# Patient Record
Sex: Female | Born: 1980 | Hispanic: Yes | Marital: Single | Smoking: Never smoker
Health system: Southern US, Community
[De-identification: ages and names within clinical notes are randomized; demographics above are authoritative.]

## PROBLEM LIST (undated history)

## (undated) ENCOUNTER — Inpatient Hospital Stay (HOSPITAL_COMMUNITY): Payer: Self-pay

## (undated) DIAGNOSIS — D259 Leiomyoma of uterus, unspecified: Secondary | ICD-10-CM

## (undated) DIAGNOSIS — R1110 Vomiting, unspecified: Secondary | ICD-10-CM

## (undated) DIAGNOSIS — D649 Anemia, unspecified: Secondary | ICD-10-CM

## (undated) HISTORY — DX: Leiomyoma of uterus, unspecified: D25.9

---

## 2013-09-21 HISTORY — PX: APPENDECTOMY: SHX54

## 2017-09-21 NOTE — L&D Delivery Note (Signed)
OB/GYN Faculty Practice Delivery Note  Gabriela Morgan is a 37 y.o. [id number] s/p SVD at [redacted]w[redacted]d. She was admitted for early labor with cervical change in multiparous patient.   ROM: 1h 64m with clear fluid GBS Status: positive - received > 2 doses of PCN Maximum Maternal Temperature: Temp (24hrs), Avg:98.1 F (36.7 C), Min:98 F (36.7 C), Max:98.3 F (36.8 C)  Labor Progress: . Admitted in early labor . AROM . Progressed to complete  Delivery Date/Time: 07/26/18 at 2231 Delivery: Called to room and patient was complete and pushing. Head delivered LOA. No nuchal cord present. Shoulder and body delivered in usual fashion. Infant with spontaneous cry, placed on mother's abdomen, dried and stimulated. Cord clamped x 2 after 1-minute delay, and cut by father of baby. Cord blood drawn. Placenta delivered spontaneously with gentle cord traction. Fundus firm with massage and Pitocin. Labia, perineum, vagina, and cervix inspected inspected with 2nd degree perineal laceration.   Placenta: spontaneous, intact, 3-vessel cord Complications: none Lacerations: 2nd degree laceration repaired with 3-0 Vicryl EBL: 108cc  Postpartum Planning [x]  message to sent to schedule follow-up  [x]  vaccines UTD  Infant: vigorous female  APGARs 8, 9  weight pending  Laurel S. Juleen China, DO OB/GYN Fellow, Faculty Practice

## 2018-02-20 ENCOUNTER — Emergency Department (HOSPITAL_COMMUNITY): Payer: Self-pay

## 2018-02-20 ENCOUNTER — Encounter (HOSPITAL_COMMUNITY): Payer: Self-pay

## 2018-02-20 ENCOUNTER — Other Ambulatory Visit: Payer: Self-pay

## 2018-02-20 ENCOUNTER — Emergency Department (HOSPITAL_COMMUNITY)
Admission: EM | Admit: 2018-02-20 | Discharge: 2018-02-20 | Disposition: A | Discharge status: Home / Self Care | Payer: Self-pay | Attending: Emergency Medicine | Admitting: Emergency Medicine

## 2018-02-20 DIAGNOSIS — Z3A16 16 weeks gestation of pregnancy: Secondary | ICD-10-CM | POA: Insufficient documentation

## 2018-02-20 DIAGNOSIS — O219 Vomiting of pregnancy, unspecified: Secondary | ICD-10-CM | POA: Insufficient documentation

## 2018-02-20 LAB — CBC
HEMATOCRIT: 38.1 % (ref 36.0–46.0)
Hemoglobin: 12.4 g/dL (ref 12.0–15.0)
MCH: 28.1 pg (ref 26.0–34.0)
MCHC: 32.5 g/dL (ref 30.0–36.0)
MCV: 86.2 fL (ref 78.0–100.0)
Platelets: 255 10*3/uL (ref 150–400)
RBC: 4.42 MIL/uL (ref 3.87–5.11)
RDW: 13.6 % (ref 11.5–15.5)
WBC: 15 10*3/uL — AB (ref 4.0–10.5)

## 2018-02-20 LAB — COMPREHENSIVE METABOLIC PANEL
ALBUMIN: 3.5 g/dL (ref 3.5–5.0)
ALT: 21 U/L (ref 14–54)
AST: 17 U/L (ref 15–41)
Alkaline Phosphatase: 46 U/L (ref 38–126)
Anion gap: 9 (ref 5–15)
BUN: 8 mg/dL (ref 6–20)
CHLORIDE: 105 mmol/L (ref 101–111)
CO2: 22 mmol/L (ref 22–32)
Calcium: 8.5 mg/dL — ABNORMAL LOW (ref 8.9–10.3)
Creatinine, Ser: 0.48 mg/dL (ref 0.44–1.00)
GFR calc Af Amer: >60 mL/min (ref >60)
GLUCOSE: 117 mg/dL — AB (ref 65–99)
POTASSIUM: 3.4 mmol/L — AB (ref 3.5–5.1)
SODIUM: 136 mmol/L (ref 135–145)
Total Bilirubin: 0.7 mg/dL (ref 0.3–1.2)
Total Protein: 7.1 g/dL (ref 6.5–8.1)

## 2018-02-20 LAB — I-STAT BETA HCG BLOOD, ED (MC, WL, AP ONLY)

## 2018-02-20 LAB — URINALYSIS, ROUTINE W REFLEX MICROSCOPIC
BILIRUBIN URINE: NEGATIVE
Glucose, UA: NEGATIVE mg/dL
Hgb urine dipstick: NEGATIVE
KETONES UR: 80 mg/dL — AB
Nitrite: NEGATIVE
Protein, ur: 100 mg/dL — AB
SPECIFIC GRAVITY, URINE: 1.031 — AB (ref 1.005–1.030)
pH: 5 (ref 5.0–8.0)

## 2018-02-20 LAB — LIPASE, BLOOD: LIPASE: 27 U/L (ref 11–51)

## 2018-02-20 MED ORDER — METOCLOPRAMIDE HCL 5 MG/ML IJ SOLN
10.0000 mg | Freq: Once | INTRAMUSCULAR | Status: AC
  Administered 2018-02-20: 10 mg via INTRAVENOUS
  Filled 2018-02-20: qty 2

## 2018-02-20 MED ORDER — DOXYLAMINE SUCCINATE (SLEEP) 25 MG PO TABS: 25.0000 mg | ORAL_TABLET | Freq: Every evening | ORAL | Status: DC | PRN

## 2018-02-20 MED ORDER — MORPHINE SULFATE (PF) 4 MG/ML IV SOLN
4.0000 mg | Freq: Once | INTRAVENOUS | Status: AC
  Administered 2018-02-20: 4 mg via INTRAVENOUS
  Filled 2018-02-20: qty 1

## 2018-02-20 MED ORDER — ALUM & MAG HYDROXIDE-SIMETH 200-200-20 MG/5ML PO SUSP
15.0000 mL | Freq: Once | ORAL | Status: AC
  Administered 2018-02-20: 15 mL via ORAL
  Filled 2018-02-20: qty 30

## 2018-02-20 MED ORDER — SODIUM CHLORIDE 0.9 % IV BOLUS
1000.0000 mL | Freq: Once | INTRAVENOUS | Status: AC
  Administered 2018-02-20: 1000 mL via INTRAVENOUS

## 2018-02-20 MED ORDER — VITAMIN B-6 25 MG PO TABS
25.0000 mg | ORAL_TABLET | Freq: Every day | ORAL | Status: DC
  Filled 2018-02-20: qty 1

## 2018-02-20 MED ORDER — METOCLOPRAMIDE HCL 10 MG PO TABS: 10.0000 mg | ORAL_TABLET | Freq: Four times a day (QID) | ORAL | 0 refills | Status: DC | PRN

## 2018-02-20 NOTE — ED Notes (Signed)
Attempted IV x2. This RN has asked another RN to start a line.

## 2018-02-20 NOTE — ED Provider Notes (Signed)
[facility] EMERGENCY DEPARTMENT Provider Note   CSN: [id number] Arrival date & time: 02/20/18  1435     History   Chief Complaint Chief Complaint  Patient presents with  . Abdominal Pain    HPI Gabriela Morgan is a 37 y.o. female.  37 yo F with a chief complaint of abdominal pain.  This is described as diffuse.  Started yesterday morning as a dull ache and then worsened throughout the day to where it was severe this morning.  She is vomited multiple times including some that she thought was bright red.  She denies diarrhea denies constipation denies vaginal bleeding or discharge.  The patient's last menstrual cycle was about 3 months ago.  She knows that she is pregnant and has not sought medical care.  She has 3 prior children that were born via vaginal delivery.  No known complications as far she could tell me.  The history is provided by the patient.  Abdominal Pain   This is a new problem. The current episode started less than 1 hour ago. The problem occurs constantly. The pain is associated with eating. The quality of the pain is sharp. The pain is at a severity of 10/10. The pain is severe. Pertinent negatives include fever, nausea, vomiting, dysuria, headaches, arthralgias and myalgias. Nothing aggravates the symptoms. Nothing relieves the symptoms.    History reviewed. No pertinent past medical history.  There are no active problems to display for this patient.   Past Surgical History:  Procedure Laterality Date  . APPENDECTOMY       OB History    Gravida  1   Para      Term      Preterm      AB      Living        SAB      TAB      Ectopic      Multiple      Live Births               Home Medications    Prior to Admission medications   Medication Sig Start Date End Date Taking? Authorizing Provider  metoCLOPramide (REGLAN) 10 MG tablet Take 1 tablet (10 mg total) by mouth every 6 (six) hours as needed for nausea  (nausea/headache). 02/20/18   Deno Etienne, DO    Family History No family history on file.  Social History Social History   Tobacco Use  . Smoking status: Never Smoker  . Smokeless tobacco: Never Used  Substance Use Topics  . Alcohol use: Not Currently  . Drug use: Not Currently     Allergies   Patient has no known allergies.   Review of Systems Review of Systems  Constitutional: Negative for chills and fever.  HENT: Negative for congestion and rhinorrhea.   Eyes: Negative for redness and visual disturbance.  Respiratory: Negative for shortness of breath and wheezing.   Cardiovascular: Negative for chest pain and palpitations.  Gastrointestinal: Positive for abdominal pain. Negative for nausea and vomiting.  Genitourinary: Negative for dysuria and urgency.  Musculoskeletal: Negative for arthralgias and myalgias.  Skin: Negative for pallor and wound.  Neurological: Negative for dizziness and headaches.     Physical Exam Updated Vital Signs BP 116/69   Pulse 86   Temp 97.9 F (36.6 C) (Oral)   Resp 14   Ht 5' 2.99" (1.6 m)   Wt 65.8 kg (145 lb)   LMP 11/19/2017 Comment: pt has not  been seen yet for this pregnancy   SpO2 99%   BMI 25.69 kg/m   Physical Exam  Constitutional: She is oriented to person, place, and time. She appears well-developed and well-nourished. No distress.  HENT:  Head: Normocephalic and atraumatic.  Eyes: Pupils are equal, round, and reactive to light. EOM are normal.  Neck: Normal range of motion. Neck supple.  Cardiovascular: Normal rate and regular rhythm. Exam reveals no gallop and no friction rub.  No murmur heard. Pulmonary/Chest: Effort normal. She has no wheezes. She has no rales.  Abdominal: Soft. She exhibits no distension. There is tenderness in the right upper quadrant. There is negative Murphy's sign.  Musculoskeletal: She exhibits no edema or tenderness.  Neurological: She is alert and oriented to person, place, and time.    Skin: Skin is warm and dry. She is not diaphoretic.  Psychiatric: She has a normal mood and affect. Her behavior is normal.  Nursing note and vitals reviewed.    ED Treatments / Results  Labs (all labs ordered are listed, but only abnormal results are displayed) Labs Reviewed  COMPREHENSIVE METABOLIC PANEL - Abnormal; Notable for the following components:      Result Value   Potassium 3.4 (*)    Glucose, Bld 117 (*)    Calcium 8.5 (*)    All other components within normal limits  CBC - Abnormal; Notable for the following components:   WBC 15.0 (*)    All other components within normal limits  URINALYSIS, ROUTINE W REFLEX MICROSCOPIC - Abnormal; Notable for the following components:   APPearance HAZY (*)    Specific Gravity, Urine 1.031 (*)    Ketones, ur 80 (*)    Protein, ur 100 (*)    Leukocytes, UA TRACE (*)    Bacteria, UA RARE (*)    All other components within normal limits  I-STAT BETA HCG BLOOD, ED ([facility], [facility], [facility] ONLY) - Abnormal; Notable for the following components:   I-stat hCG, quantitative >2,000.0 (*)    All other components within normal limits  LIPASE, BLOOD    EKG None  Radiology US Abdomen Complete  Result Date: 02/20/2018 CLINICAL DATA:  Right upper quadrant pain for 1 day, currently pregnant EXAM: ABDOMEN ULTRASOUND COMPLETE COMPARISON:  None. FINDINGS: Gallbladder: No gallstones or wall thickening visualized. No sonographic Murphy sign noted by sonographer. Common bile duct: Diameter: 2 mm Liver: No focal lesion identified. Within normal limits in parenchymal echogenicity. Portal vein is patent on color Doppler imaging with normal direction of blood flow towards the liver. IVC: No abnormality visualized. Pancreas: Visualized portion unremarkable. Spleen: Size and appearance within normal limits. Right Kidney: Length: 11.7 cm. Mild hydronephrosis is noted. This is likely related to pressure on the ureter from the gravid uterus. Left Kidney: Length: 11.3 cm.  Echogenicity within normal limits. No mass or hydronephrosis visualized. Abdominal aorta: No aneurysm visualized. Other findings: Note is made of uterine fibroid measuring 5.4 cm in greatest dimension in the fundus on the left. IMPRESSION: Mild right hydronephrosis likely related to compression of the ureter by the gravid uterus. Uterine fibroid as described. Electronically Signed   By: Inez Catalina M.D.   On: 02/20/2018 20:46    Procedures Procedures (including critical care time) EMERGENCY DEPARTMENT [place] PREGNANCY "Study: Limited Ultrasound of the Pelvis"  INDICATIONS:Pregnancy(required) and Abdominal or pelvic pain Multiple views of the uterus and pelvic cavity are obtained with a multi-frequency probe.  APPROACH:Transabdominal   PERFORMED BY: Myself  IMAGES ARCHIVED?: Yes  LIMITATIONS:  pain  PREGNANCY FREE FLUID: None  PREGNANCY UTERUS FINDINGS:Uterus enlarged and Gestational sac noted ADNEXAL FINDINGS:Left ovary not seen and Right ovary not seen  PREGNANCY FINDINGS: Intrauterine gestational sac noted and Fetal heart activity seen  INTERPRETATION: Intrauterine gestational sac noted and Fetal heart activity seen  GESTATIONAL AGE, ESTIMATE: 16wk 3 days  Procedure note: Ultrasound Guided Peripheral IV Ultrasound guided peripheral 1.88 inch angiocath IV placement performed by me. Indications: Nursing unable to place IV. Details: The antecubital fossa and upper arm were evaluated with a multifrequency linear probe. Patent brachial veins were noted. 1 attempt was made to cannulate a vein under realtime US guidance with successful cannulation of the vein and catheter placement. There is return of non-pulsatile dark red blood. The patient tolerated the procedure well without complications. Images archived electronically.  CPT codes: [id number] and [phone]   Medications Ordered in ED Medications  vitamin B-6 (pyridOXINE) tablet 25 mg (has no administration in time range)  morphine 4 MG/ML  injection 4 mg (4 mg Intravenous Given 02/20/18 1814)  metoCLOPramide (REGLAN) injection 10 mg (10 mg Intravenous Given 02/20/18 1813)  sodium chloride 0.9 % bolus 1,000 mL (0 mLs Intravenous Stopped 02/20/18 2120)  alum & mag hydroxide-simeth (MAALOX/MYLANTA) 200-200-20 MG/5ML suspension 15 mL (15 mLs Oral Given 02/20/18 2119)  metoCLOPramide (REGLAN) injection 10 mg (10 mg Intravenous Given 02/20/18 2120)     Initial Impression / Assessment and Plan / ED Course  I have reviewed the triage vital signs and the nursing notes.  Pertinent labs & imaging results that were available during my care of the patient were reviewed by me and considered in my medical decision making (see chart for details).     37 yo F gravida 4 para 3-0-0-3 with a chief complaint of diffuse abdominal pain and vomiting.  Going on for the past couple days.  Worsening throughout the day.  Patient is writhing around on the stretcher.  There is no focal abdominal pain but she seems to be worse in the right upper quadrant.  She also is about 3 months pregnant and has not sought OB care due to insurance issues.  Bedside ultrasound with IUP.  No adnexal masses noted on my ultrasound.  I will try to spare radiation and do an ultrasound of the right upper quadrant and bilateral flanks.  Treat symptomatically.  I discussed with her that there is a risk to any medication given.  Feeling better.  [place] with R hydro, but thought to be secondary to uterine compression of the ureter.  No hematuria.  Tolerating PO.  Will do trial zantac, given script for reglan, given information to make diclegis at home.  Discussed at length need for OB prenatal care.   10:18 PM:  I have discussed the diagnosis/risks/treatment options with the patient and family and believe the pt to be eligible for discharge home to follow-up with OBGYN. We also discussed returning to the ED immediately if new or worsening sx occur. We discussed the sx which are most concerning (e.g.,  sudden worsening pain, fever, inability to tolerate by mouth) that necessitate immediate return. Medications administered to the patient during their visit and any new prescriptions provided to the patient are listed below.  Medications given during this visit Medications  vitamin B-6 (pyridOXINE) tablet 25 mg (has no administration in time range)  morphine 4 MG/ML injection 4 mg (4 mg Intravenous Given 02/20/18 1814)  metoCLOPramide (REGLAN) injection 10 mg (10 mg Intravenous Given 02/20/18 1813)  sodium chloride 0.9 %  bolus 1,000 mL (0 mLs Intravenous Stopped 02/20/18 2120)  alum & mag hydroxide-simeth (MAALOX/MYLANTA) 200-200-20 MG/5ML suspension 15 mL (15 mLs Oral Given 02/20/18 2119)  metoCLOPramide (REGLAN) injection 10 mg (10 mg Intravenous Given 02/20/18 2120)    The patient appears reasonably screen and/or stabilized for discharge and I doubt any other medical condition or other [facility] requiring further screening, evaluation, or treatment in the ED at this time prior to discharge.    Final Clinical Impressions(s) / ED Diagnoses   Final diagnoses:  Nausea and vomiting in pregnancy    ED Discharge Orders        Ordered    metoCLOPramide (REGLAN) 10 MG tablet  Every 6 hours PRN     02/20/18 2130       Deno Etienne, DO 02/20/18 2218

## 2018-02-20 NOTE — ED Notes (Signed)
Pt returned to room from US.

## 2018-02-20 NOTE — ED Notes (Signed)
Patient transported to Ultrasound 

## 2018-02-20 NOTE — ED Triage Notes (Signed)
With the interpreter iPad, pt reports pain in lower abdomen with a lot of vomiting. Pt reports blood in vomit and that it is sometimes black.

## 2018-02-20 NOTE — Discharge Instructions (Addendum)
Try zantac.  Tylenol for pain.    Go to a drugstore and buy unisom and vitaminb6(pyridoxine) Take 1/2 a tab of unisom(12.5mg ) and 25mg  of vitamin b6.  Take this at night before bed.  If you continue to have nausea and vomiting take twice a day.  [facility]  Return to the ED if you are unable to keep anything down.

## 2018-02-22 ENCOUNTER — Inpatient Hospital Stay (HOSPITAL_COMMUNITY)
Admission: AD | Admit: 2018-02-22 | Discharge: 2018-02-22 | Disposition: A | Discharge status: Home / Self Care | Payer: Self-pay | Source: Ambulatory Visit | Attending: Obstetrics & Gynecology | Admitting: Obstetrics & Gynecology

## 2018-02-22 ENCOUNTER — Encounter (HOSPITAL_COMMUNITY): Payer: Self-pay | Admitting: *Deleted

## 2018-02-22 ENCOUNTER — Other Ambulatory Visit: Payer: Self-pay

## 2018-02-22 DIAGNOSIS — Z79899 Other long term (current) drug therapy: Secondary | ICD-10-CM | POA: Insufficient documentation

## 2018-02-22 DIAGNOSIS — R1010 Upper abdominal pain, unspecified: Secondary | ICD-10-CM | POA: Insufficient documentation

## 2018-02-22 DIAGNOSIS — O219 Vomiting of pregnancy, unspecified: Secondary | ICD-10-CM | POA: Diagnosis present

## 2018-02-22 DIAGNOSIS — Z3A13 13 weeks gestation of pregnancy: Secondary | ICD-10-CM | POA: Insufficient documentation

## 2018-02-22 DIAGNOSIS — O26891 Other specified pregnancy related conditions, first trimester: Secondary | ICD-10-CM | POA: Insufficient documentation

## 2018-02-22 LAB — COMPREHENSIVE METABOLIC PANEL
ALK PHOS: 50 U/L (ref 38–126)
ALT: 24 U/L (ref 14–54)
ANION GAP: 12 (ref 5–15)
AST: 20 U/L (ref 15–41)
Albumin: 3.4 g/dL — ABNORMAL LOW (ref 3.5–5.0)
BUN: 7 mg/dL (ref 6–20)
CALCIUM: 8.4 mg/dL — AB (ref 8.9–10.3)
CO2: 20 mmol/L — ABNORMAL LOW (ref 22–32)
CREATININE: 0.4 mg/dL — AB (ref 0.44–1.00)
Chloride: 99 mmol/L — ABNORMAL LOW (ref 101–111)
Glucose, Bld: 106 mg/dL — ABNORMAL HIGH (ref 65–99)
Potassium: 3 mmol/L — ABNORMAL LOW (ref 3.5–5.1)
SODIUM: 131 mmol/L — AB (ref 135–145)
TOTAL PROTEIN: 6.8 g/dL (ref 6.5–8.1)
Total Bilirubin: 0.4 mg/dL (ref 0.3–1.2)

## 2018-02-22 LAB — URINALYSIS, ROUTINE W REFLEX MICROSCOPIC
Glucose, UA: NEGATIVE mg/dL
KETONES UR: 15 mg/dL — AB
NITRITE: NEGATIVE
PH: 6.5 (ref 5.0–8.0)
Protein, ur: 30 mg/dL — AB
Specific Gravity, Urine: 1.025 (ref 1.005–1.030)

## 2018-02-22 LAB — CBC WITH DIFFERENTIAL/PLATELET
Basophils Absolute: 0 10*3/uL (ref 0.0–0.1)
Basophils Relative: 0 %
EOS ABS: 0 10*3/uL (ref 0.0–0.7)
Eosinophils Relative: 0 %
HEMATOCRIT: 36 % (ref 36.0–46.0)
HEMOGLOBIN: 12.5 g/dL (ref 12.0–15.0)
LYMPHS ABS: 1.6 10*3/uL (ref 0.7–4.0)
LYMPHS PCT: 11 %
MCH: 28.7 pg (ref 26.0–34.0)
MCHC: 34.7 g/dL (ref 30.0–36.0)
MCV: 82.6 fL (ref 78.0–100.0)
MONOS PCT: 7 %
Monocytes Absolute: 1 10*3/uL (ref 0.1–1.0)
NEUTROS PCT: 82 %
Neutro Abs: 11.5 10*3/uL — ABNORMAL HIGH (ref 1.7–7.7)
Platelets: 254 10*3/uL (ref 150–400)
RBC: 4.36 MIL/uL (ref 3.87–5.11)
RDW: 13.6 % (ref 11.5–15.5)
WBC: 14.1 10*3/uL — ABNORMAL HIGH (ref 4.0–10.5)

## 2018-02-22 LAB — URINALYSIS, MICROSCOPIC (REFLEX): Squamous Epithelial / LPF: >50 (ref 0–5)

## 2018-02-22 LAB — AMYLASE: AMYLASE: 61 U/L (ref 28–100)

## 2018-02-22 LAB — LIPASE, BLOOD: LIPASE: 45 U/L (ref 11–51)

## 2018-02-22 MED ORDER — PROMETHAZINE HCL 25 MG/ML IJ SOLN
25.0000 mg | Freq: Once | INTRAVENOUS | Status: AC
  Administered 2018-02-22: 25 mg via INTRAVENOUS
  Filled 2018-02-22: qty 1

## 2018-02-22 MED ORDER — ONDANSETRON 4 MG PO TBDP: 4.0000 mg | ORAL_TABLET | Freq: Three times a day (TID) | ORAL | 0 refills | Status: DC | PRN

## 2018-02-22 MED ORDER — DIPHENHYDRAMINE HCL 50 MG/ML IJ SOLN
25.0000 mg | INTRAMUSCULAR | Status: AC
  Administered 2018-02-22: 25 mg via INTRAVENOUS
  Filled 2018-02-22: qty 1

## 2018-02-22 MED ORDER — M.V.I. ADULT IV INJ
Freq: Once | INTRAVENOUS | Status: AC
  Administered 2018-02-22: 20:00:00 via INTRAVENOUS
  Filled 2018-02-22: qty 1000

## 2018-02-22 MED ORDER — SODIUM CHLORIDE 0.9 % IV SOLN
8.0000 mg | Freq: Once | INTRAVENOUS | Status: AC
  Administered 2018-02-22: 8 mg via INTRAVENOUS
  Filled 2018-02-22: qty 4

## 2018-02-22 MED ORDER — FAMOTIDINE IN NACL 20-0.9 MG/50ML-% IV SOLN
20.0000 mg | Freq: Once | INTRAVENOUS | Status: AC
  Administered 2018-02-22: 20 mg via INTRAVENOUS
  Filled 2018-02-22: qty 50

## 2018-02-22 MED ORDER — GLYCOPYRROLATE 1 MG PO TABS: 1.0000 mg | ORAL_TABLET | Freq: Three times a day (TID) | ORAL | 0 refills | Status: DC

## 2018-02-22 MED ORDER — PROMETHAZINE HCL 25 MG RE SUPP: 25.0000 mg | Freq: Four times a day (QID) | RECTAL | 3 refills | Status: DC | PRN

## 2018-02-22 MED ORDER — RANITIDINE HCL 150 MG PO TABS: 150.0000 mg | ORAL_TABLET | Freq: Two times a day (BID) | ORAL | 0 refills | Status: DC

## 2018-02-22 MED ORDER — DEXAMETHASONE SODIUM PHOSPHATE 10 MG/ML IJ SOLN
10.0000 mg | INTRAMUSCULAR | Status: AC
  Administered 2018-02-22: 10 mg via INTRAVENOUS
  Filled 2018-02-22: qty 1

## 2018-02-22 NOTE — MAU Note (Signed)
Pregnant, having a lot of vomiting.  Pain in upper abd from throwing up.  States was given medication for nausea when at [facility], is taking it. ? LMP 3/1.  Arrived in [place] 2 months ago.

## 2018-02-22 NOTE — MAU Provider Note (Addendum)
History     CSN: [id number]  Arrival date and time: 02/22/18 1539   First Provider Initiated Contact with Patient 02/22/18 1713 - assessment done with assistance from [place]    Chief Complaint  Patient presents with  . Abdominal Pain  . Emesis   HPI  [name] is a 37 y.o. year old G18P2103 female at [redacted]w[redacted]d weeks gestation who presents to MAU reporting "a lot of vomiting and upper abdominal pain from throwing up". She states she "unable to keep anything down; food or fluids". She reports "losing >10 lbs in 3 wks". She was given Zofran at [facility], but it not working all that well. She just arrived from [place] 2 months ago. LMP is 11/19/2017.  Past Medical History:  Diagnosis Date  . Medical history non-contributory     Past Surgical History:  Procedure Laterality Date  . APPENDECTOMY      No family history on file.  Social History   Tobacco Use  . Smoking status: Never Smoker  . Smokeless tobacco: Never Used  Substance Use Topics  . Alcohol use: Not Currently  . Drug use: Not Currently    Allergies: No Known Allergies  Medications Prior to Admission  Medication Sig Dispense Refill Last Dose  . metoCLOPramide (REGLAN) 10 MG tablet Take 1 tablet (10 mg total) by mouth every 6 (six) hours as needed for nausea (nausea/headache). 6 tablet 0 02/22/2018 at Unknown time  . Prenatal Vit-Fe Fumarate-FA (PRENATAL MULTIVITAMIN) TABS tablet Take 1 tablet by mouth daily at 12 noon.   02/22/2018 at Unknown time    Review of Systems  Constitutional: Positive for appetite change and fatigue.  HENT: Negative.   Eyes: Negative.   Respiratory: Negative.   Cardiovascular: Negative.   Gastrointestinal: Positive for abdominal pain (upper abd), nausea and vomiting ("unable to hold down anything").  Endocrine: Negative.   Genitourinary: Negative.   Musculoskeletal: Negative.   Skin: Negative.   Allergic/Immunologic: Negative.   Neurological: Positive for  weakness and headaches.  Hematological: Negative.   Psychiatric/Behavioral: Negative.    Physical Exam   Pulse 93, temperature 98.1 F (36.7 C), temperature source Oral, resp. rate 20, weight 124 lb 12 oz (56.6 kg), last menstrual period 11/19/2017, SpO2 99 %. Last documented weight from [facility] provider note was 145 lbs on 02/20/2018 -- the patient denies that she was weighed or asked what her last weight was on. Therefore the last documented weight cannot be used to determine weight loss. However, per the patient, she "knows she has lost more than 10 lbs in last 3 wks".  Physical Exam  Nursing note and vitals reviewed. Constitutional: She is oriented to person, place, and time. She appears well-developed. She appears lethargic. She appears ill. She appears distressed.  HENT:  Head: Normocephalic and atraumatic.  Mouth/Throat: Mucous membranes are dry.  Lips pale and dry  Eyes: Pupils are equal, round, and reactive to light.  Neck: Normal range of motion.  Cardiovascular: Normal rate, regular rhythm and normal heart sounds.  Respiratory: Effort normal and breath sounds normal.  GI: Soft. Bowel sounds are normal.  Genitourinary:  Genitourinary Comments: Pelvic deferred  Musculoskeletal: Normal range of motion.  Neurological: She is oriented to person, place, and time. She appears lethargic.  Skin: Skin is warm and dry.  Psychiatric: She has a normal mood and affect. Her behavior is normal. Judgment and thought content normal.    MAU Course  Procedures  MDM CCUA CBC w/Diff CMP Amylase  Lipase Pepcid 20 mg IVPB Phenergan 25 mg in D5LR 1000 ml @ 999 ml/hr -- no improvement MVI in LR 1000 ml @ 500 ml/hr Decadron 10 mg IVP Zofran 8 mg IVPB Benadryl 25 mg IVP   Results for orders placed or performed during the hospital encounter of 02/22/18 (from the past 24 hour(s))  Urinalysis, Routine w reflex microscopic     Status: Abnormal   Collection Time: 02/22/18  4:04 PM  Result Value  Ref Range   Color, Urine YELLOW YELLOW   APPearance CLEAR CLEAR   Specific Gravity, Urine 1.025 1.005 - 1.030   pH 6.5 5.0 - 8.0   Glucose, UA NEGATIVE NEGATIVE mg/dL   Hgb urine dipstick MODERATE (A) NEGATIVE   Bilirubin Urine SMALL (A) NEGATIVE   Ketones, ur 15 (A) NEGATIVE mg/dL   Protein, ur 30 (A) NEGATIVE mg/dL   Nitrite NEGATIVE NEGATIVE   Leukocytes, UA SMALL (A) NEGATIVE  Urinalysis, Microscopic (reflex)     Status: Abnormal   Collection Time: 02/22/18  4:04 PM  Result Value Ref Range   RBC / HPF 0-5 0 - 5 RBC/hpf   WBC, UA 6-10 0 - 5 WBC/hpf   Bacteria, UA FEW (A) NONE SEEN   Squamous Epithelial / LPF >50 0 - 5  CBC with Differential/Platelet     Status: Abnormal   Collection Time: 02/22/18  5:53 PM  Result Value Ref Range   WBC 14.1 (H) 4.0 - 10.5 K/uL   RBC 4.36 3.87 - 5.11 MIL/uL   Hemoglobin 12.5 12.0 - 15.0 g/dL   HCT 36.0 36.0 - 46.0 %   MCV 82.6 78.0 - 100.0 fL   MCH 28.7 26.0 - 34.0 pg   MCHC 34.7 30.0 - 36.0 g/dL   RDW 13.6 11.5 - 15.5 %   Platelets 254 150 - 400 K/uL   Neutrophils Relative % 82 %   Neutro Abs 11.5 (H) 1.7 - 7.7 K/uL   Lymphocytes Relative 11 %   Lymphs Abs 1.6 0.7 - 4.0 K/uL   Monocytes Relative 7 %   Monocytes Absolute 1.0 0.1 - 1.0 K/uL   Eosinophils Relative 0 %   Eosinophils Absolute 0.0 0.0 - 0.7 K/uL   Basophils Relative 0 %   Basophils Absolute 0.0 0.0 - 0.1 K/uL  Comprehensive metabolic panel     Status: Abnormal   Collection Time: 02/22/18  5:53 PM  Result Value Ref Range   Sodium 131 (L) 135 - 145 mmol/L   Potassium 3.0 (L) 3.5 - 5.1 mmol/L   Chloride 99 (L) 101 - 111 mmol/L   CO2 20 (L) 22 - 32 mmol/L   Glucose, Bld 106 (H) 65 - 99 mg/dL   BUN 7 6 - 20 mg/dL   Creatinine, Ser 0.40 (L) 0.44 - 1.00 mg/dL   Calcium 8.4 (L) 8.9 - 10.3 mg/dL   Total Protein 6.8 6.5 - 8.1 g/dL   Albumin 3.4 (L) 3.5 - 5.0 g/dL   AST 20 15 - 41 U/L   ALT 24 14 - 54 U/L   Alkaline Phosphatase 50 38 - 126 U/L   Total Bilirubin 0.4 0.3 -  1.2 mg/dL   GFR calc non Af Amer >60 >60 mL/min   GFR calc Af Amer >60 >60 mL/min   Anion gap 12 5 - 15  Amylase     Status: None   Collection Time: 02/22/18  5:53 PM  Result Value Ref Range   Amylase 61 28 - 100 U/L  Lipase,  blood     Status: None   Collection Time: 02/22/18  5:53 PM  Result Value Ref Range   Lipase 45 11 - 51 U/L      Assessment and Plan  Nausea and vomiting during pregnancy prior to [redacted] weeks gestation - Plan: Discharge patient - Continue Reglan as prescribed - Rx for Phenergan 25 suppositories 1 pv hs or prn if Reglan not working - Rx for Zofran 8 mg ODT every 8 prn Phenergan and Reglan not working - Rx for Zantac 150 mg BID 30 mins after antiemetic - Rx for Robinol 1 mg TID prn hypersalivation - Call to schedule NOB with [place] in 2 wks; msg left to admin pool to call pt to schedule NOB - Patient verbalized an understanding of the plan of care and agrees.    Laury Deep, MSN, CNM 02/22/2018, 5:17 PM

## 2018-02-22 NOTE — Discharge Instructions (Signed)

## 2018-02-22 NOTE — MAU Note (Signed)
Urine sent to lab not enough for culture

## 2018-02-23 ENCOUNTER — Telehealth: Payer: Self-pay | Admitting: General Practice

## 2018-02-23 NOTE — Telephone Encounter (Signed)
Left message (with interpreter) for patient to give our office a call back in regards to appointment scheduled for 03/02/18 at 3:15pm.

## 2018-03-02 ENCOUNTER — Encounter: Payer: Self-pay | Admitting: Nurse Practitioner

## 2018-03-16 ENCOUNTER — Inpatient Hospital Stay (HOSPITAL_COMMUNITY)
Admission: AD | Admit: 2018-03-16 | Discharge: 2018-03-19 | DRG: 833 | Disposition: A | Discharge status: Home / Self Care | Payer: Medicaid Other | Attending: Obstetrics and Gynecology | Admitting: Obstetrics and Gynecology

## 2018-03-16 ENCOUNTER — Other Ambulatory Visit: Payer: Self-pay

## 2018-03-16 ENCOUNTER — Emergency Department (HOSPITAL_COMMUNITY): Payer: Medicaid Other

## 2018-03-16 DIAGNOSIS — R102 Pelvic and perineal pain: Secondary | ICD-10-CM

## 2018-03-16 DIAGNOSIS — IMO0002 Reserved for concepts with insufficient information to code with codable children: Secondary | ICD-10-CM

## 2018-03-16 DIAGNOSIS — E876 Hypokalemia: Secondary | ICD-10-CM

## 2018-03-16 DIAGNOSIS — Z363 Encounter for antenatal screening for malformations: Secondary | ICD-10-CM

## 2018-03-16 DIAGNOSIS — D259 Leiomyoma of uterus, unspecified: Secondary | ICD-10-CM | POA: Diagnosis present

## 2018-03-16 DIAGNOSIS — O26892 Other specified pregnancy related conditions, second trimester: Secondary | ICD-10-CM

## 2018-03-16 DIAGNOSIS — O3412 Maternal care for benign tumor of corpus uteri, second trimester: Secondary | ICD-10-CM | POA: Diagnosis present

## 2018-03-16 DIAGNOSIS — R109 Unspecified abdominal pain: Secondary | ICD-10-CM | POA: Diagnosis present

## 2018-03-16 DIAGNOSIS — O210 Mild hyperemesis gravidarum: Secondary | ICD-10-CM | POA: Diagnosis present

## 2018-03-16 DIAGNOSIS — Z3492 Encounter for supervision of normal pregnancy, unspecified, second trimester: Secondary | ICD-10-CM

## 2018-03-16 DIAGNOSIS — O09522 Supervision of elderly multigravida, second trimester: Secondary | ICD-10-CM

## 2018-03-16 DIAGNOSIS — O219 Vomiting of pregnancy, unspecified: Secondary | ICD-10-CM | POA: Diagnosis present

## 2018-03-16 DIAGNOSIS — Z3A16 16 weeks gestation of pregnancy: Secondary | ICD-10-CM

## 2018-03-16 DIAGNOSIS — Z0489 Encounter for examination and observation for other specified reasons: Secondary | ICD-10-CM

## 2018-03-16 DIAGNOSIS — O211 Hyperemesis gravidarum with metabolic disturbance: Principal | ICD-10-CM | POA: Diagnosis present

## 2018-03-16 DIAGNOSIS — K117 Disturbances of salivary secretion: Secondary | ICD-10-CM | POA: Diagnosis present

## 2018-03-16 DIAGNOSIS — O99612 Diseases of the digestive system complicating pregnancy, second trimester: Secondary | ICD-10-CM | POA: Diagnosis present

## 2018-03-16 DIAGNOSIS — O3410 Maternal care for benign tumor of corpus uteri, unspecified trimester: Secondary | ICD-10-CM

## 2018-03-16 LAB — CBC WITH DIFFERENTIAL/PLATELET
ABS IMMATURE GRANULOCYTES: 0.1 10*3/uL (ref 0.0–0.1)
Basophils Absolute: 0 10*3/uL (ref 0.0–0.1)
Basophils Relative: 0 %
Eosinophils Absolute: 0 10*3/uL (ref 0.0–0.7)
Eosinophils Relative: 0 %
HCT: 39.3 % (ref 36.0–46.0)
HEMOGLOBIN: 12.2 g/dL (ref 12.0–15.0)
Immature Granulocytes: 1 %
LYMPHS PCT: 9 %
Lymphs Abs: 1.4 10*3/uL (ref 0.7–4.0)
MCH: 27.6 pg (ref 26.0–34.0)
MCHC: 31 g/dL (ref 30.0–36.0)
MCV: 88.9 fL (ref 78.0–100.0)
MONO ABS: 0.8 10*3/uL (ref 0.1–1.0)
MONOS PCT: 5 %
NEUTROS ABS: 12.6 10*3/uL — AB (ref 1.7–7.7)
Neutrophils Relative %: 85 %
Platelets: 323 10*3/uL (ref 150–400)
RBC: 4.42 MIL/uL (ref 3.87–5.11)
RDW: 13.9 % (ref 11.5–15.5)
WBC: 14.9 10*3/uL — ABNORMAL HIGH (ref 4.0–10.5)

## 2018-03-16 LAB — COMPREHENSIVE METABOLIC PANEL
ALT: 26 U/L (ref 0–44)
AST: 20 U/L (ref 15–41)
Albumin: 3.4 g/dL — ABNORMAL LOW (ref 3.5–5.0)
Alkaline Phosphatase: 67 U/L (ref 38–126)
Anion gap: 13 (ref 5–15)
BUN: 8 mg/dL (ref 6–20)
CO2: 25 mmol/L (ref 22–32)
CREATININE: 0.52 mg/dL (ref 0.44–1.00)
Calcium: 8.8 mg/dL — ABNORMAL LOW (ref 8.9–10.3)
Chloride: 97 mmol/L — ABNORMAL LOW (ref 98–111)
Glucose, Bld: 102 mg/dL — ABNORMAL HIGH (ref 70–99)
Potassium: 3.1 mmol/L — ABNORMAL LOW (ref 3.5–5.1)
Sodium: 135 mmol/L (ref 135–145)
Total Bilirubin: 0.9 mg/dL (ref 0.3–1.2)
Total Protein: 7.5 g/dL (ref 6.5–8.1)

## 2018-03-16 LAB — URINALYSIS, ROUTINE W REFLEX MICROSCOPIC
Bilirubin Urine: NEGATIVE
Glucose, UA: NEGATIVE mg/dL
KETONES UR: 80 mg/dL — AB
NITRITE: NEGATIVE
PH: 5 (ref 5.0–8.0)
Protein, ur: 30 mg/dL — AB
Specific Gravity, Urine: 1.026 (ref 1.005–1.030)

## 2018-03-16 LAB — LIPASE, BLOOD: LIPASE: 32 U/L (ref 11–51)

## 2018-03-16 MED ORDER — METOCLOPRAMIDE HCL 5 MG/ML IJ SOLN
10.0000 mg | Freq: Once | INTRAMUSCULAR | Status: AC
  Administered 2018-03-16: 10 mg via INTRAVENOUS
  Filled 2018-03-16: qty 2

## 2018-03-16 MED ORDER — PANTOPRAZOLE SODIUM 40 MG IV SOLR
40.0000 mg | Freq: Once | INTRAVENOUS | Status: AC
  Administered 2018-03-16: 40 mg via INTRAVENOUS
  Filled 2018-03-16: qty 40

## 2018-03-16 MED ORDER — POTASSIUM CHLORIDE CRYS ER 20 MEQ PO TBCR
40.0000 meq | EXTENDED_RELEASE_TABLET | Freq: Once | ORAL | Status: AC
  Administered 2018-03-16: 40 meq via ORAL
  Filled 2018-03-16: qty 2

## 2018-03-16 MED ORDER — FENTANYL CITRATE (PF) 100 MCG/2ML IJ SOLN
100.0000 ug | Freq: Once | INTRAMUSCULAR | Status: AC
  Administered 2018-03-16: 100 ug via INTRAVENOUS
  Filled 2018-03-16: qty 2

## 2018-03-16 MED ORDER — SODIUM CHLORIDE 0.9 % IV BOLUS
1000.0000 mL | Freq: Once | INTRAVENOUS | Status: AC
  Administered 2018-03-16: 1000 mL via INTRAVENOUS

## 2018-03-16 MED ORDER — PROMETHAZINE HCL 25 MG/ML IJ SOLN
25.0000 mg | Freq: Once | INTRAMUSCULAR | Status: AC
  Administered 2018-03-16: 25 mg via INTRAVENOUS
  Filled 2018-03-16: qty 1

## 2018-03-16 MED ORDER — ONDANSETRON HCL 4 MG/2ML IJ SOLN
4.0000 mg | Freq: Once | INTRAMUSCULAR | Status: DC
  Filled 2018-03-16: qty 2

## 2018-03-16 NOTE — ED Triage Notes (Signed)
Pt c/o abdominal pain, back, and NV. Pt reports that she is 5 months pregnant. Denies urinary symptoms.

## 2018-03-16 NOTE — ED Provider Notes (Signed)
[facility]PART[facility] Provider Note   CSN: [id number] Arrival date & time: 03/16/18  1737     History   Chief Complaint Chief Complaint  Patient presents with  . Abdominal Pain    HPI Gabriela Morgan is a 37 y.o. female.  HPI  37 year old G4, P3 at about 16 weeks and 5 days pregnancy presents with abdominal pain and back pain.  Started yesterday.  Similar to when she was here earlier in the month.  She does have been having vomiting as well.  No fevers or diarrhea and no urinary symptoms.  This is happened to her in an earlier pregnancy as well.  Pain is severe.  She tried Tylenol but vomited back up. No vaginal bleeding or loss of fluid.  Past Medical History:  Diagnosis Date  . Medical history non-contributory     Patient Active Problem List   Diagnosis Date Noted  . Nausea and vomiting during pregnancy prior to [redacted] weeks gestation 02/22/2018    Past Surgical History:  Procedure Laterality Date  . APPENDECTOMY       OB History    Gravida  4   Para  3   Term  2   Preterm  1   AB      Living  3     SAB      TAB      Ectopic      Multiple      Live Births               Home Medications    Prior to Admission medications   Medication Sig Start Date End Date Taking? Authorizing Provider  glycopyrrolate (ROBINUL) 1 MG tablet Take 1 tablet (1 mg total) by mouth 3 (three) times daily. 02/22/18  Yes Laury Deep, CNM  metoCLOPramide (REGLAN) 10 MG tablet Take 1 tablet (10 mg total) by mouth every 6 (six) hours as needed for nausea (nausea/headache). 02/20/18  Yes Deno Etienne, DO  ondansetron (ZOFRAN ODT) 4 MG disintegrating tablet Take 1 tablet (4 mg total) by mouth every 8 (eight) hours as needed for nausea or vomiting. When phenergan or reglan don't work 02/22/18  Yes Renato Battles, Darrold Span, CNM  Prenatal Vit-Fe Fumarate-FA (PRENATAL MULTIVITAMIN) TABS tablet Take 1 tablet by mouth daily at 12 noon.   Yes [provider]    promethazine (PHENERGAN) 25 MG suppository Place 1 suppository (25 mg total) rectally every 6 (six) hours as needed for nausea or vomiting. USE VAGINALLY AT BEDTIME 02/22/18  Yes Laury Deep, CNM  ranitidine (ZANTAC) 150 MG tablet Take 1 tablet (150 mg total) by mouth 2 (two) times daily. 02/22/18  Yes Laury Deep, CNM    Family History No family history on file.  Social History Social History   Tobacco Use  . Smoking status: Never Smoker  . Smokeless tobacco: Never Used  Substance Use Topics  . Alcohol use: Not Currently  . Drug use: Not Currently     Allergies   Patient has no known allergies.   Review of Systems Review of Systems  Constitutional: Negative for fever.  Cardiovascular: Negative for leg swelling.  Gastrointestinal: Positive for abdominal pain, nausea and vomiting.  Genitourinary: Negative for dysuria, vaginal bleeding, vaginal discharge and vaginal pain.  Musculoskeletal: Positive for back pain.  Neurological: Positive for headaches.  All other systems reviewed and are negative.    Physical Exam Updated Vital Signs BP (!) 127/99   Pulse 98   Temp 98.8 F (37.1  C)   Resp 18   Ht 5\' 2"  (1.575 m)   Wt 65.8 kg (145 lb)   LMP 11/19/2017 Comment: pt has not been seen yet for this pregnancy   SpO2 97%   BMI 26.52 kg/m   Physical Exam  Constitutional: She is oriented to person, place, and time. She appears well-developed and well-nourished.  Non-toxic appearance. She does not appear ill. She appears distressed (in pain).  HENT:  Head: Normocephalic and atraumatic.  Right Ear: External ear normal.  Left Ear: External ear normal.  Nose: Nose normal.  Eyes: Right eye exhibits no discharge. Left eye exhibits no discharge.  Cardiovascular: Regular rhythm and normal heart sounds. Tachycardia present.  HR low 100s  Pulmonary/Chest: Effort normal and breath sounds normal.  Abdominal: Soft. There is tenderness (diffuse). There is no CVA tenderness.   gravid  Neurological: She is alert and oriented to person, place, and time.  Skin: Skin is warm and dry.  Nursing note and vitals reviewed.    ED Treatments / Results  Labs (all labs ordered are listed, but only abnormal results are displayed) Labs Reviewed  CBC WITH DIFFERENTIAL/PLATELET - Abnormal; Notable for the following components:      Result Value   WBC 14.9 (*)    Neutro Abs 12.6 (*)    All other components within normal limits  COMPREHENSIVE METABOLIC PANEL - Abnormal; Notable for the following components:   Potassium 3.1 (*)    Chloride 97 (*)    Glucose, Bld 102 (*)    Calcium 8.8 (*)    Albumin 3.4 (*)    All other components within normal limits  URINALYSIS, ROUTINE W REFLEX MICROSCOPIC - Abnormal; Notable for the following components:   Color, Urine AMBER (*)    APPearance HAZY (*)    Hgb urine dipstick MODERATE (*)    Ketones, ur 80 (*)    Protein, ur 30 (*)    Leukocytes, UA SMALL (*)    Bacteria, UA RARE (*)    All other components within normal limits  URINE CULTURE  LIPASE, BLOOD    EKG None  Radiology US Ob Limited  Result Date: 03/16/2018 CLINICAL DATA:  37 year old pregnant female with lower abdominal/back pain for 1 day. EDC by LMP: 07/31/2018, projecting to an expected gestational age of [redacted] weeks 3 days. EXAM: LIMITED OBSTETRIC ULTRASOUND FINDINGS: Number of Fetuses: 1 Heart Rate:  153 bpm Movement: Yes Presentation: Variable Placental Location: Anterior Previa: No Amniotic Fluid (Subjective):  Within normal limits. BPD: 4.6 cm 19 w  6 d MATERNAL FINDINGS: Cervix:  Appears closed. Uterus/Adnexae: No adnexal masses. There is a 4.9 x 4.8 x 6.6 cm fibroid in the fundal uterus. IMPRESSION: 1. Single living intrauterine gestation at 19 weeks 6 days by limited fetal biometry, concordant with provided menstrual dating. No acute gestational abnormality. 2. Fundal 6.6 cm uterine fibroid. This exam is performed on an emergent basis and does not  comprehensively evaluate fetal size, dating, or anatomy. The patient is due for a dedicated fetal anatomic survey on an outpatient basis if not recently performed. Electronically Signed   By: Ilona Sorrel M.D.   On: 03/16/2018 21:04   US Renal  Result Date: 03/16/2018 CLINICAL DATA:  Back pain for 1 day EXAM: RENAL / URINARY TRACT ULTRASOUND COMPLETE COMPARISON:  None. FINDINGS: Right Kidney: Length: 12 cm. Echogenicity within normal limits. No mass or hydronephrosis visualized. Left Kidney: Length: 11.7 cm. Echogenicity within normal limits. No mass or hydronephrosis visualized. Bladder: Appears  normal for degree of bladder distention. Bilateral ureteral jets are noted. IMPRESSION: Normal renal ultrasound.  No hydronephrosis bilaterally. Electronically Signed   By: Abelardo Diesel M.D.   On: 03/16/2018 20:55    Procedures Procedures (including critical care time)  Medications Ordered in ED Medications  fentaNYL (SUBLIMAZE) injection 100 mcg (100 mcg Intravenous Given 03/16/18 1841)  metoCLOPramide (REGLAN) injection 10 mg (10 mg Intravenous Given 03/16/18 1841)  sodium chloride 0.9 % bolus 1,000 mL (0 mLs Intravenous Stopped 03/16/18 1942)  potassium chloride SA (K-DUR,KLOR-CON) CR tablet 40 mEq (40 mEq Oral Given 03/16/18 2139)  promethazine (PHENERGAN) injection 25 mg (25 mg Intravenous Given 03/16/18 2223)  pantoprazole (PROTONIX) injection 40 mg (40 mg Intravenous Given 03/16/18 2230)     Initial Impression / Assessment and Plan / ED Course  I have reviewed the triage vital signs and the nursing notes.  Pertinent labs & imaging results that were available during my care of the patient were reviewed by me and considered in my medical decision making (see chart for details).     We discussed risks and potential side effects to baby from IV medicines.  However patient is willing to try medicines given her degree of pain.  She has generalized abdominal tenderness.  However no vaginal bleeding.   She has fetal heart rate tones detected at 180.  Ultrasound shows viable IUP with no obvious other findings besides uterine fibroid.  Patient symptoms were much better initially but now vomiting again.  Discussed with Dr. Elonda Husky, who recommends giving IV Phenergan, IV Protonix, and asking about marijuana use.  She denies marijuana use.  If the treatments do not help, she may need admission.  Advised to call back if no help but if she is able to be discharged then discharged with Phenergan suppositories.  Otherwise she has been hydrated.  She was initially feeling better but due to vomiting the potassium she will need further supportive care.  Care to [facility] with reevaluation pending.  Final Clinical Impressions(s) / ED Diagnoses   Final diagnoses:  Abdominal pain during pregnancy in second trimester  Vomiting pregnancy  Hypokalemia    ED Discharge Orders    None       Sherwood Gambler, MD 03/16/18 2305

## 2018-03-16 NOTE — ED Provider Notes (Signed)
Patient placed in Quick Look pathway, seen and evaluated   Chief Complaint: n/v,abd and back pain  HPI:  G4P3, 5 mos preg.  abd pain, back pain  Yesterday, + vomiting, no diarrhea. No urinary or vaginal ss.   ROS: abd pain  (one)  Physical Exam:   Gen: No distress  Neuro: Awake and Alert  Skin: Warm    Focused Exam:  Exquisitely ttp over the uterus     Initiation of care has begun. The patient has been counseled on the process, plan, and necessity for staying for the completion/evaluation, and the remainder of the medical screening examination\   Ned Grace 03/16/18 2037    Mesner, Corene Cornea, MD 03/17/18 1622

## 2018-03-16 NOTE — ED Notes (Signed)
Pt vomiting, EDP made aware

## 2018-03-16 NOTE — ED Notes (Signed)
Patient transported to [facility] 

## 2018-03-16 NOTE — ED Notes (Signed)
ED Provider at bedside. 

## 2018-03-16 NOTE — ED Provider Notes (Signed)
11:49 PM Patient reassessed after having been given Protonix and Phenergan.  Still feeling very nauseated, still vomiting, and uncomfortable.  I discussed the case with Dr. Elonda Husky, who recommends transfer to women's.   Montine Circle, PA-C 03/16/18 [place], MD 03/17/18 [phone]

## 2018-03-17 ENCOUNTER — Inpatient Hospital Stay (HOSPITAL_COMMUNITY): Payer: Medicaid Other

## 2018-03-17 ENCOUNTER — Encounter (HOSPITAL_COMMUNITY): Payer: Self-pay

## 2018-03-17 ENCOUNTER — Other Ambulatory Visit: Payer: Self-pay

## 2018-03-17 DIAGNOSIS — O99612 Diseases of the digestive system complicating pregnancy, second trimester: Secondary | ICD-10-CM | POA: Diagnosis present

## 2018-03-17 DIAGNOSIS — R109 Unspecified abdominal pain: Secondary | ICD-10-CM

## 2018-03-17 DIAGNOSIS — Z3A16 16 weeks gestation of pregnancy: Secondary | ICD-10-CM

## 2018-03-17 DIAGNOSIS — K117 Disturbances of salivary secretion: Secondary | ICD-10-CM | POA: Diagnosis present

## 2018-03-17 DIAGNOSIS — D259 Leiomyoma of uterus, unspecified: Secondary | ICD-10-CM | POA: Diagnosis present

## 2018-03-17 DIAGNOSIS — O26892 Other specified pregnancy related conditions, second trimester: Secondary | ICD-10-CM | POA: Diagnosis not present

## 2018-03-17 DIAGNOSIS — O3412 Maternal care for benign tumor of corpus uteri, second trimester: Secondary | ICD-10-CM | POA: Diagnosis present

## 2018-03-17 DIAGNOSIS — O211 Hyperemesis gravidarum with metabolic disturbance: Secondary | ICD-10-CM | POA: Diagnosis present

## 2018-03-17 LAB — CBC
HCT: 29.8 % — ABNORMAL LOW (ref 36.0–46.0)
Hemoglobin: 9.8 g/dL — ABNORMAL LOW (ref 12.0–15.0)
MCH: 28.7 pg (ref 26.0–34.0)
MCHC: 32.9 g/dL (ref 30.0–36.0)
MCV: 87.4 fL (ref 78.0–100.0)
PLATELETS: 262 10*3/uL (ref 150–400)
RBC: 3.41 MIL/uL — ABNORMAL LOW (ref 3.87–5.11)
RDW: 14 % (ref 11.5–15.5)
WBC: 11.3 10*3/uL — AB (ref 4.0–10.5)

## 2018-03-17 LAB — CREATININE, SERUM: Creatinine, Ser: 0.34 mg/dL — ABNORMAL LOW (ref 0.44–1.00)

## 2018-03-17 MED ORDER — ENOXAPARIN SODIUM 40 MG/0.4ML ~~LOC~~ SOLN
40.0000 mg | SUBCUTANEOUS | Status: DC
  Administered 2018-03-17 – 2018-03-18 (×2): 40 mg via SUBCUTANEOUS
  Filled 2018-03-17 (×2): qty 0.4

## 2018-03-17 MED ORDER — SODIUM CHLORIDE 0.9 % IV SOLN
25.0000 mg | INTRAVENOUS | Status: DC
  Administered 2018-03-17 – 2018-03-19 (×5): 25 mg via INTRAVENOUS
  Filled 2018-03-17 (×5): qty 1

## 2018-03-17 MED ORDER — FENTANYL CITRATE (PF) 100 MCG/2ML IJ SOLN
50.0000 ug | INTRAMUSCULAR | Status: DC | PRN
  Administered 2018-03-17 – 2018-03-18 (×3): 50 ug via INTRAVENOUS
  Filled 2018-03-17 (×3): qty 2

## 2018-03-17 MED ORDER — PROMETHAZINE HCL 25 MG/ML IJ SOLN
25.0000 mg | INTRAMUSCULAR | Status: DC | PRN
  Administered 2018-03-17: 25 mg via INTRAVENOUS
  Filled 2018-03-17: qty 1

## 2018-03-17 MED ORDER — DEXTROSE IN LACTATED RINGERS 5 % IV SOLN
INTRAVENOUS | Status: DC
  Administered 2018-03-17: 04:00:00 via INTRAVENOUS

## 2018-03-17 MED ORDER — POTASSIUM CHLORIDE 10 MEQ/100ML IV SOLN
10.0000 meq | INTRAVENOUS | Status: AC
Start: 2018-03-17 — End: 2018-03-17
  Administered 2018-03-17 (×4): 10 meq via INTRAVENOUS
  Filled 2018-03-17 (×4): qty 100

## 2018-03-17 MED ORDER — KCL IN DEXTROSE-NACL 40-5-0.45 MEQ/L-%-% IV SOLN
INTRAVENOUS | Status: DC
  Filled 2018-03-17: qty 1000

## 2018-03-17 MED ORDER — PANTOPRAZOLE SODIUM 40 MG IV SOLR
40.0000 mg | Freq: Every day | INTRAVENOUS | Status: DC
  Administered 2018-03-17 – 2018-03-18 (×2): 40 mg via INTRAVENOUS
  Filled 2018-03-17 (×4): qty 40

## 2018-03-17 MED ORDER — POTASSIUM CHLORIDE 2 MEQ/ML IV SOLN
INTRAVENOUS | Status: DC
  Administered 2018-03-17 – 2018-03-19 (×6): via INTRAVENOUS
  Filled 2018-03-17 (×7): qty 1000

## 2018-03-17 MED ORDER — METOCLOPRAMIDE HCL 5 MG/ML IJ SOLN
10.0000 mg | Freq: Three times a day (TID) | INTRAMUSCULAR | Status: DC
  Administered 2018-03-17 – 2018-03-19 (×7): 10 mg via INTRAVENOUS
  Filled 2018-03-17 (×7): qty 2

## 2018-03-17 NOTE — ED Notes (Signed)
Report given to Waunita Schooner, paramedic [facility]

## 2018-03-17 NOTE — ED Notes (Addendum)
Report given to [facility]

## 2018-03-17 NOTE — Progress Notes (Signed)
Interpretor services used for admission assessment. Pt. Ok with using family for interpretor needs past this point. Form signed and in chart.

## 2018-03-17 NOTE — ED Notes (Signed)
Pt signed transfer consent.

## 2018-03-17 NOTE — ED Notes (Signed)
Pt leaving via [facility]

## 2018-03-17 NOTE — H&P (Signed)
History              Chief Complaint    Chief Complaint  Patient presents with  . Abdominal Pain    HPI Gabriela Morgan is a 37 y.o. female.  HPI  37 year old G4, P3 at about 16 weeks and 5 days by her dates pregnancy presents with abdominal pain and back pain.  Started yesterday.  Similar to when she was here earlier in the month 02/22/2018.  She does have been having vomiting as well.  No fevers or diarrhea and no urinary symptoms.  This is happened to her in an earlier pregnancy as well.  Pain is severe.  She tried Tylenol but vomited back up. No vaginal bleeding or loss of fluid.  Basically this is the same presentation as 4 weeks ago, and it happened off and on throughout the entire pregnancy.      Past Medical History:  Diagnosis Date  . Medical history non-contributory         Patient Active Problem List   Diagnosis Date Noted  . Nausea and vomiting during pregnancy prior to [redacted] weeks gestation 02/22/2018    Past Surgical History:  Procedure Laterality Date  . APPENDECTOMY                          OB History    Gravida  4   Para  3   Term  2   Preterm  1   AB      Living  3     SAB      TAB      Ectopic      Multiple      Live Births               Home Medications                      Prior to Admission medications   Medication Sig Start Date End Date Taking? Authorizing Provider  glycopyrrolate (ROBINUL) 1 MG tablet Take 1 tablet (1 mg total) by mouth 3 (three) times daily. 02/22/18  Yes Laury Deep, CNM  metoCLOPramide (REGLAN) 10 MG tablet Take 1 tablet (10 mg total) by mouth every 6 (six) hours as needed for nausea (nausea/headache). 02/20/18  Yes Deno Etienne, DO  ondansetron (ZOFRAN ODT) 4 MG disintegrating tablet Take 1 tablet (4 mg total) by mouth every 8 (eight) hours as needed for nausea or vomiting. When phenergan or reglan don't work 02/22/18  Yes Renato Battles, Darrold Span, CNM  Prenatal Vit-Fe Fumarate-FA (PRENATAL  MULTIVITAMIN) TABS tablet Take 1 tablet by mouth daily at 12 noon.   Yes [provider]  promethazine (PHENERGAN) 25 MG suppository Place 1 suppository (25 mg total) rectally every 6 (six) hours as needed for nausea or vomiting. USE VAGINALLY AT BEDTIME 02/22/18  Yes Laury Deep, CNM  ranitidine (ZANTAC) 150 MG tablet Take 1 tablet (150 mg total) by mouth 2 (two) times daily. 02/22/18  Yes Laury Deep, CNM    Family History No family history on file.  Social History Social History       Tobacco Use  . Smoking status: Never Smoker  . Smokeless tobacco: Never Used  Substance Use Topics  . Alcohol use: Not Currently  . Drug use: Not Currently     Allergies              Patient has no known allergies.   Review of Systems  Review of Systems  Constitutional: Negative for fever.  Cardiovascular: Negative for leg swelling.  Gastrointestinal: Positive for abdominal pain, nausea and vomiting.  Genitourinary: Negative for dysuria, vaginal bleeding, vaginal discharge and vaginal pain.  Musculoskeletal: Positive for back pain.  Neurological: Positive for headaches.  All other systems reviewed and are negative.    Physical Exam Updated Vital Signs BP (!) 127/99   Pulse 98   Temp 98.8 F (37.1 C)   Resp 18   Ht 5\' 2"  (1.575 m)   Wt 65.8 kg (145 lb)   LMP 11/19/2017 Comment: pt has not been seen yet for this pregnancy   SpO2 97%   BMI 26.52 kg/m   Physical Exam  Constitutional: She is oriented to person, place, and time. She appears well-developed and well-nourished.  Non-toxic appearance. She does not appear ill. She appears distressed (in pain).  HENT:  Head: Normocephalic and atraumatic.  Right Ear: External ear normal.  Left Ear: External ear normal.  Nose: Nose normal.  Eyes: Right eye exhibits no discharge. Left eye exhibits no discharge.  Cardiovascular: Regular rhythm and normal heart sounds. Tachycardia present.  HR low 100s   Pulmonary/Chest: Effort normal and breath sounds normal.  Abdominal: Soft. There is tenderness (diffuse). There is no CVA tenderness.  gravid  Neurological: She is alert and oriented to person, place, and time.  Skin: Skin is warm and dry.  Nursing note and vitals reviewed.    ED Treatments / Results  Labs (all labs ordered are listed, but only abnormal results are displayed)      Labs Reviewed  CBC WITH DIFFERENTIAL/PLATELET - Abnormal; Notable for the following components:      Result Value    WBC 14.9 (*)    Neutro Abs 12.6 (*)    All other components within normal limits  COMPREHENSIVE METABOLIC PANEL - Abnormal; Notable for the following components:   Potassium 3.1 (*)    Chloride 97 (*)    Glucose, Bld 102 (*)    Calcium 8.8 (*)    Albumin 3.4 (*)    All other components within normal limits  URINALYSIS, ROUTINE W REFLEX MICROSCOPIC - Abnormal; Notable for the following components:   Color, Urine AMBER (*)    APPearance HAZY (*)    Hgb urine dipstick MODERATE (*)    Ketones, ur 80 (*)    Protein, ur 30 (*)    Leukocytes, UA SMALL (*)    Bacteria, UA RARE (*)    All other components within normal limits  URINE CULTURE  LIPASE, BLOOD    EKG None  Radiology  Imaging Results (Last 48 hours)  US Ob Limited  Result Date: 03/16/2018 CLINICAL DATA:  37 year old pregnant female with lower abdominal/back pain for 1 day. EDC by LMP: 07/31/2018, projecting to an expected gestational age of [redacted] weeks 3 days. EXAM: LIMITED OBSTETRIC ULTRASOUND FINDINGS: Number of Fetuses: 1 Heart Rate:  153 bpm Movement: Yes Presentation: Variable Placental Location: Anterior Previa: No Amniotic Fluid (Subjective):  Within normal limits. BPD: 4.6 cm 19 w  6 d MATERNAL FINDINGS: Cervix:  Appears closed. Uterus/Adnexae: No adnexal masses. There is a 4.9 x 4.8 x 6.6 cm fibroid in the fundal uterus. IMPRESSION: 1. Single living intrauterine gestation at 19  weeks 6 days by limited fetal biometry, concordant with provided menstrual dating. No acute gestational abnormality. 2. Fundal 6.6 cm uterine fibroid. This exam is performed on an emergent basis and does not comprehensively evaluate fetal size, dating, or anatomy. The  patient is due for a dedicated fetal anatomic survey on an outpatient basis if not recently performed. Electronically Signed   By: Ilona Sorrel M.D.   On: 03/16/2018 21:04   US Renal  Result Date: 03/16/2018 CLINICAL DATA:  Back pain for 1 day EXAM: RENAL / URINARY TRACT ULTRASOUND COMPLETE COMPARISON:  None. FINDINGS: Right Kidney: Length: 12 cm. Echogenicity within normal limits. No mass or hydronephrosis visualized. Left Kidney: Length: 11.7 cm. Echogenicity within normal limits. No mass or hydronephrosis visualized. Bladder: Appears normal for degree of bladder distention. Bilateral ureteral jets are noted. IMPRESSION: Normal renal ultrasound.  No hydronephrosis bilaterally. Electronically Signed   By: Abelardo Diesel M.D.   On: 03/16/2018 20:55     Procedures Procedures (including critical care time)  Medications Ordered in ED Medications  fentaNYL (SUBLIMAZE) injection 100 mcg (100 mcg Intravenous Given 03/16/18 1841)  metoCLOPramide (REGLAN) injection 10 mg (10 mg Intravenous Given 03/16/18 1841)  sodium chloride 0.9 % bolus 1,000 mL (0 mLs Intravenous Stopped 03/16/18 1942)  potassium chloride SA (K-DUR,KLOR-CON) CR tablet 40 mEq (40 mEq Oral Given 03/16/18 2139)  promethazine (PHENERGAN) injection 25 mg (25 mg Intravenous Given 03/16/18 2223)  pantoprazole (PROTONIX) injection 40 mg (40 mg Intravenous Given 03/16/18 2230)    Impression [redacted]w[redacted]d-[redacted]w[redacted]d IUPwith recurrent abdominal pain and cyclical N/V 7 cm leiomyoma, which may be current contributing source of pain hypokalemia  Plan Definitive sonogram today K+ replacement Nausea and pain management  Florian Buff, MD 03/17/2018 8:37 AM

## 2018-03-18 DIAGNOSIS — O210 Mild hyperemesis gravidarum: Secondary | ICD-10-CM | POA: Diagnosis present

## 2018-03-18 DIAGNOSIS — O219 Vomiting of pregnancy, unspecified: Secondary | ICD-10-CM

## 2018-03-18 LAB — CBC WITH DIFFERENTIAL/PLATELET
BASOS ABS: 0 10*3/uL (ref 0.0–0.1)
BASOS PCT: 0 %
Eosinophils Absolute: 0 10*3/uL (ref 0.0–0.7)
Eosinophils Relative: 0 %
HEMATOCRIT: 28.6 % — AB (ref 36.0–46.0)
HEMOGLOBIN: 9.5 g/dL — AB (ref 12.0–15.0)
LYMPHS PCT: 19 %
Lymphs Abs: 1.8 10*3/uL (ref 0.7–4.0)
MCH: 29 pg (ref 26.0–34.0)
MCHC: 33.2 g/dL (ref 30.0–36.0)
MCV: 87.2 fL (ref 78.0–100.0)
Monocytes Absolute: 0.4 10*3/uL (ref 0.1–1.0)
Monocytes Relative: 4 %
NEUTROS ABS: 6.9 10*3/uL (ref 1.7–7.7)
NEUTROS PCT: 77 %
Platelets: 233 10*3/uL (ref 150–400)
RBC: 3.28 MIL/uL — AB (ref 3.87–5.11)
RDW: 13.9 % (ref 11.5–15.5)
WBC: 9.1 10*3/uL (ref 4.0–10.5)

## 2018-03-18 LAB — COMPREHENSIVE METABOLIC PANEL
ALBUMIN: 2.7 g/dL — AB (ref 3.5–5.0)
ALK PHOS: 47 U/L (ref 38–126)
ALT: 19 U/L (ref 0–44)
AST: 15 U/L (ref 15–41)
Anion gap: 7 (ref 5–15)
BILIRUBIN TOTAL: 0.9 mg/dL (ref 0.3–1.2)
CO2: 19 mmol/L — ABNORMAL LOW (ref 22–32)
Calcium: 7.6 mg/dL — ABNORMAL LOW (ref 8.9–10.3)
Chloride: 104 mmol/L (ref 98–111)
GLUCOSE: 114 mg/dL — AB (ref 70–99)
POTASSIUM: 3.7 mmol/L (ref 3.5–5.1)
Sodium: 130 mmol/L — ABNORMAL LOW (ref 135–145)
TOTAL PROTEIN: 5.7 g/dL — AB (ref 6.5–8.1)

## 2018-03-18 LAB — URINE CULTURE

## 2018-03-18 NOTE — Progress Notes (Signed)
Patient ID: Gabriela Morgan, female   DOB: May 13, 1981, 37 y.o.   MRN: [id number] [place]) NOTE  Gabriela Morgan is a 37 y.o. [id number] [redacted]w[redacted]d by LMP who is admitted for hyperemesis and abdominal pain in pregnancy.    Fetal presentation is unsure. Length of Stay:  1  Days  Date of admission:03/16/2018  Subjective: Patient reports feeling much better in comparison to yesterday with near complete resolution of her abdominal pain and significant improvement in her nausea Patient reports the fetal movement as active. Patient reports uterine contraction  activity as none. Patient reports  vaginal bleeding as none. Patient describes fluid per vagina as None.  Vitals:  Blood pressure 114/84, pulse 79, temperature 98.2 F (36.8 C), temperature source Oral, resp. rate 18, height 5\' 4"  (1.626 m), weight 125 lb (56.7 kg), last menstrual period 11/19/2017, SpO2 100 %. Vitals:   03/17/18 1553 03/17/18 1922 03/17/18 2309 03/18/18 0802  BP: 93/64 116/81 108/70 114/84  Pulse: 99 81 79 79  Resp: 16 17 18 18   Temp: 99 F (37.2 C) 98.9 F (37.2 C) 98 F (36.7 C) 98.2 F (36.8 C)  TempSrc: Oral Oral Oral Oral  SpO2: 99% 100% 100% 100%  Weight:      Height:       Physical Examination:  General appearance - alert, well appearing, and in no distress Abdomen - soft, gravid, non tender Fundal Height:  size greater than dates Pelvic Exam:  examination not indicated Extremities: extremities normal, atraumatic, no cyanosis or edema with DTRs 2+ bilaterally Membranes:intact  Fetal Monitoring:  + doppler  Labs:  Results for orders placed or performed during the hospital encounter of 03/16/18 (from the past 24 hour(s))  CBC with Differential/Platelet   Collection Time: 03/18/18  5:17 AM  Result Value Ref Range   WBC 9.1 4.0 - 10.5 K/uL   RBC 3.28 (L) 3.87 - 5.11 MIL/uL   Hemoglobin 9.5 (L) 12.0 - 15.0 g/dL   HCT 28.6 (L) 36.0 - 46.0 %   MCV 87.2 78.0 - 100.0 fL   MCH  29.0 26.0 - 34.0 pg   MCHC 33.2 30.0 - 36.0 g/dL   RDW 13.9 11.5 - 15.5 %   Platelets 233 150 - 400 K/uL   Neutrophils Relative % 77 %   Neutro Abs 6.9 1.7 - 7.7 K/uL   Lymphocytes Relative 19 %   Lymphs Abs 1.8 0.7 - 4.0 K/uL   Monocytes Relative 4 %   Monocytes Absolute 0.4 0.1 - 1.0 K/uL   Eosinophils Relative 0 %   Eosinophils Absolute 0.0 0.0 - 0.7 K/uL   Basophils Relative 0 %   Basophils Absolute 0.0 0.0 - 0.1 K/uL  Comprehensive metabolic panel   Collection Time: 03/18/18  5:17 AM  Result Value Ref Range   Sodium 130 (L) 135 - 145 mmol/L   Potassium 3.7 3.5 - 5.1 mmol/L   Chloride 104 98 - 111 mmol/L   CO2 19 (L) 22 - 32 mmol/L   Glucose, Bld 114 (H) 70 - 99 mg/dL   BUN <5 (L) 6 - 20 mg/dL   Creatinine, Ser <0.30 (L) 0.44 - 1.00 mg/dL   Calcium 7.6 (L) 8.9 - 10.3 mg/dL   Total Protein 5.7 (L) 6.5 - 8.1 g/dL   Albumin 2.7 (L) 3.5 - 5.0 g/dL   AST 15 15 - 41 U/L   ALT 19 0 - 44 U/L   Alkaline Phosphatase 47 38 - 126 U/L   Total Bilirubin 0.9 0.3 -  1.2 mg/dL   GFR calc non Af Amer NOT CALCULATED >60 mL/min   GFR calc Af Amer NOT CALCULATED >60 mL/min   Anion gap 7 5 - 15    Imaging Studies:    Anatomy ultrasound report pending  Medications:  Scheduled . enoxaparin (LOVENOX) injection  40 mg Subcutaneous Q24H  . metoCLOPramide (REGLAN) injection  10 mg Intravenous Q8H  . pantoprazole  40 mg Intravenous Daily   I have reviewed the patient's current medications.  ASSESSMENT: [id number] [redacted]w[redacted]d Estimated Date of Delivery: 08/26/18  Patient Active Problem List   Diagnosis Date Noted  . Hyperemesis affecting pregnancy, antepartum 03/18/2018  . Nausea and vomiting during pregnancy prior to [redacted] weeks gestation 02/22/2018    PLAN: - Continue antiemetics - Advance diet to clear liquids - Continue current care - Follow up anatomy ultrasound report  Katharin Schneider 03/18/2018,9:33 AM

## 2018-03-18 NOTE — Plan of Care (Signed)
Patient is resting in bed at this time. She continues on IV fluids as ordered without any difficulty. Patient denies any pain at this time. No emesis noted. However, she has been observed spitting in an emesis bag.

## 2018-03-19 NOTE — Discharge Instructions (Signed)
Uterine Fibroids Uterine fibroids are tissue masses (tumors). They are also called leiomyomas. They can develop inside of a womans womb (uterus). They can grow very large. Fibroids are not cancerous (benign). Most fibroids do not require medical treatment. Follow these instructions at home:  Keep all follow-up visits as told by your doctor. This is important.  Take medicines only as told by your doctor. ? If you were prescribed a hormone treatment, take the hormone medicines exactly as told. ? Do not take aspirin. It can cause bleeding.  Ask your doctor about taking iron pills and increasing the amount of dark green, leafy vegetables in your diet. These actions can help to boost your blood iron levels.  Pay close attention to your period. Tell your doctor about any changes, such as: ? Increased blood flow. This may require you to use more pads or tampons than usual per month. ? A change in the number of days that your period lasts per month. ? A change in symptoms that come with your period, such as back pain or cramping in your belly area (abdomen). Contact a doctor if:  You have pain in your back or the area between your hip bones (pelvic area) that is not controlled by medicines.  You have pain in your abdomen that is not controlled with medicines.  You have an increase in bleeding between and during periods.  You soak tampons or pads in a half hour or less.  You feel lightheaded.  You feel extra tired.  You feel weak. Get help right away if:  You pass out (faint).  You have a sudden increase in pelvic pain. This information is not intended to replace advice given to you by your health care provider. Make sure you discuss any questions you have with your health care provider. Document Released: 10/10/2010 Document Revised: 05/08/2016 Document Reviewed: 03/06/2014 Elsevier Interactive Patient Education  2018 [employer]. Hyperemesis Gravidarum Hyperemesis gravidarum is a  severe form of nausea and vomiting that happens during pregnancy. Hyperemesis is worse than morning sickness. It may cause you to have nausea or vomiting all day for many days. It may keep you from eating and drinking enough food and liquids. Hyperemesis usually occurs during the first half (the first 20 weeks) of pregnancy. It often goes away once a woman is in her second half of pregnancy. However, sometimes hyperemesis continues through an entire pregnancy. What are the causes? The cause of this condition is not known. It may be related to changes in chemicals (hormones) in the body during pregnancy, such as the high level of pregnancy hormone (human chorionic gonadotropin) or the increase in the female sex hormone (estrogen). What are the signs or symptoms? Symptoms of this condition include:  Severe nausea and vomiting.  Nausea that does not go away.  Vomiting that does not allow you to keep any food down.  Weight loss.  Body fluid loss (dehydration).  Having no desire to eat, or not liking food that you have previously enjoyed.  How is this diagnosed? This condition may be diagnosed based on:  A physical exam.  Your medical history.  Your symptoms.  Blood tests.  Urine tests.  How is this treated? This condition may be managed with medicine. If medicines to do not help relieve nausea and vomiting, you may need to receive fluids through an IV tube at the hospital. Follow these instructions at home:  Take over-the-counter and prescription medicines only as told by your health care provider.  Avoid iron pills and multivitamins that contain iron for the first 3-4 months of pregnancy. If you take prescription iron pills, do not stop taking them unless your health care provider approves.  Take the following actions to help prevent nausea and vomiting: ? In the morning, before getting out of bed, try eating a couple of dry crackers or a piece of toast. ? Avoid foods and smells  that upset your stomach. Fatty and spicy foods may make nausea worse. ? Eat 5-6 small meals a day. ? Do not drink fluids while eating meals. Drink between meals. ? Eat or suck on things that have ginger in them. Ginger can help relieve nausea. ? Avoid food preparation. The smell of food can spoil your appetite or trigger nausea.  Follow instructions from your health care provider about eating or drinking restrictions.  For snacks, eat high-protein foods, such as cheese.  Keep all follow-up and pre-birth (prenatal) visits as told by your health care provider. This is important. Contact a health care provider if:  You have pain in your abdomen.  You have a severe headache.  You have vision problems.  You are losing weight. Get help right away if:  You cannot drink fluids without vomiting.  You vomit blood.  You have constant nausea and vomiting.  You are very weak.  You are very thirsty.  You feel dizzy.  You faint.  You have a fever or other symptoms that last for more than 2-3 days.  You have a fever and your symptoms suddenly get worse. Summary  Hyperemesis gravidarum is a severe form of nausea and vomiting that happens during pregnancy.  Making some changes to your eating habits may help relieve nausea and vomiting.  This condition may be managed with medicine.  If medicines to do not help relieve nausea and vomiting, you may need to receive fluids through an IV tube at the hospital. This information is not intended to replace advice given to you by your health care provider. Make sure you discuss any questions you have with your health care provider. Document Released: 09/07/2005 Document Revised: 05/06/2016 Document Reviewed: 05/06/2016 Elsevier Interactive Patient Education  2017 [employer].

## 2018-03-19 NOTE — Discharge Summary (Signed)
Antenatal Physician Discharge Summary  Patient ID: Gabriela Morgan MRN: [id number] DOB/AGE: 01-03-1981 37 y.o.  Admit date: 03/16/2018 Discharge date: 03/19/2018  Admission Diagnoses:Principal Problem:   Nausea and vomiting during pregnancy prior to [redacted] weeks gestation Active Problems:   Hyperemesis affecting pregnancy, antepartum Abdominal pain Fibroid uterus Ptyalism    Discharge Diagnoses: Same  Prenatal Procedures: none  Consults: None  Hospital Course:  This is a 37 y.o. [id number] with IUP at [redacted]w[redacted]d admitted for abdominal pain and hyperemesis. She has had limited prenatal care. She was admitted with pain over 6 cm fibroid and N/V. Placed on IV anti-emetics, given pain meds. IV hydration. She continued Robinul, Reglan, Zofran and phenergan. PPI added.  Her pain improved significantly. Her diet was slowly advanced. She had a normal anatomy u/s. She was deemed stable for discharge to home with outpatient follow up.  Discharge Exam: Temp:  [97.9 F (36.6 C)-98.7 F (37.1 C)] 98.3 F (36.8 C) (06/28 2349) Pulse Rate:  [78-85] 85 (06/28 2349) Resp:  [16-18] 17 (06/28 2349) BP: (109-129)/(73-88) 129/88 (06/28 2349) SpO2:  [100 %] 100 % (06/28 2349) Physical Examination: CONSTITUTIONAL: Well-developed, well-nourished female in no acute distress.  HENT:  Normocephalic, atraumatic, External right and left ear normal.  EYES: Conjunctivae and EOM are normal. No scleral icterus.  NECK: Normal range of motion, supple, no masses SKIN: Skin is warm and dry. No rash noted.  [place]: Alert and oriented to person, place, and time. PSYCHIATRIC: Normal mood and affect. Normal behavior.  CARDIOVASCULAR: Normal heart rate noted, regular rhythm RESPIRATORY: Effort and breath sounds normal. MUSCULOSKELETAL: Normal range of motion. No edema and no tenderness. 2+ distal pulses. ABDOMEN: Soft, nontender, nondistended, gravid.   Significant Diagnostic Studies:  Results for orders placed or  performed during the hospital encounter of 03/16/18 (from the past 168 hour(s))  CBC with Differential   Collection Time: 03/16/18  6:21 PM  Result Value Ref Range   WBC 14.9 (H) 4.0 - 10.5 K/uL   RBC 4.42 3.87 - 5.11 MIL/uL   Hemoglobin 12.2 12.0 - 15.0 g/dL   HCT 39.3 36.0 - 46.0 %   MCV 88.9 78.0 - 100.0 fL   MCH 27.6 26.0 - 34.0 pg   MCHC 31.0 30.0 - 36.0 g/dL   RDW 13.9 11.5 - 15.5 %   Platelets 323 150 - 400 K/uL   Neutrophils Relative % 85 %   Neutro Abs 12.6 (H) 1.7 - 7.7 K/uL   Lymphocytes Relative 9 %   Lymphs Abs 1.4 0.7 - 4.0 K/uL   Monocytes Relative 5 %   Monocytes Absolute 0.8 0.1 - 1.0 K/uL   Eosinophils Relative 0 %   Eosinophils Absolute 0.0 0.0 - 0.7 K/uL   Basophils Relative 0 %   Basophils Absolute 0.0 0.0 - 0.1 K/uL   Immature Granulocytes 1 %   Abs Immature Granulocytes 0.1 0.0 - 0.1 K/uL  Comprehensive metabolic panel   Collection Time: 03/16/18  6:21 PM  Result Value Ref Range   Sodium 135 135 - 145 mmol/L   Potassium 3.1 (L) 3.5 - 5.1 mmol/L   Chloride 97 (L) 98 - 111 mmol/L   CO2 25 22 - 32 mmol/L   Glucose, Bld 102 (H) 70 - 99 mg/dL   BUN 8 6 - 20 mg/dL   Creatinine, Ser 0.52 0.44 - 1.00 mg/dL   Calcium 8.8 (L) 8.9 - 10.3 mg/dL   Total Protein 7.5 6.5 - 8.1 g/dL   Albumin 3.4 (L) 3.5 - 5.0  g/dL   AST 20 15 - 41 U/L   ALT 26 0 - 44 U/L   Alkaline Phosphatase 67 38 - 126 U/L   Total Bilirubin 0.9 0.3 - 1.2 mg/dL   GFR calc non Af Amer >60 >60 mL/min   GFR calc Af Amer >60 >60 mL/min   Anion gap 13 5 - 15  Lipase, blood   Collection Time: 03/16/18  6:21 PM  Result Value Ref Range   Lipase 32 11 - 51 U/L  Urinalysis, Routine w reflex microscopic   Collection Time: 03/16/18  6:23 PM  Result Value Ref Range   Color, Urine AMBER (A) YELLOW   APPearance HAZY (A) CLEAR   Specific Gravity, Urine 1.026 1.005 - 1.030   pH 5.0 5.0 - 8.0   Glucose, UA NEGATIVE NEGATIVE mg/dL   Hgb urine dipstick MODERATE (A) NEGATIVE   Bilirubin Urine NEGATIVE  NEGATIVE   Ketones, ur 80 (A) NEGATIVE mg/dL   Protein, ur 30 (A) NEGATIVE mg/dL   Nitrite NEGATIVE NEGATIVE   Leukocytes, UA SMALL (A) NEGATIVE   RBC / HPF 0-5 0 - 5 RBC/hpf   WBC, UA 11-20 0 - 5 WBC/hpf   Bacteria, UA RARE (A) NONE SEEN   Squamous Epithelial / LPF 11-20 0 - 5   Mucus PRESENT   Urine culture   Collection Time: 03/16/18 10:28 PM  Result Value Ref Range   Specimen Description URINE, CLEAN CATCH    Special Requests      NONE Performed at [facility], [address] [place], [place] [address]    Culture MULTIPLE SPECIES PRESENT, SUGGEST RECOLLECTION (A)    Report Status 03/18/2018 FINAL   CBC   Collection Time: 03/17/18  9:07 AM  Result Value Ref Range   WBC 11.3 (H) 4.0 - 10.5 K/uL   RBC 3.41 (L) 3.87 - 5.11 MIL/uL   Hemoglobin 9.8 (L) 12.0 - 15.0 g/dL   HCT 29.8 (L) 36.0 - 46.0 %   MCV 87.4 78.0 - 100.0 fL   MCH 28.7 26.0 - 34.0 pg   MCHC 32.9 30.0 - 36.0 g/dL   RDW 14.0 11.5 - 15.5 %   Platelets 262 150 - 400 K/uL  Creatinine, serum   Collection Time: 03/17/18  9:07 AM  Result Value Ref Range   Creatinine, Ser 0.34 (L) 0.44 - 1.00 mg/dL   GFR calc non Af Amer >60 >60 mL/min   GFR calc Af Amer >60 >60 mL/min  CBC with Differential/Platelet   Collection Time: 03/18/18  5:17 AM  Result Value Ref Range   WBC 9.1 4.0 - 10.5 K/uL   RBC 3.28 (L) 3.87 - 5.11 MIL/uL   Hemoglobin 9.5 (L) 12.0 - 15.0 g/dL   HCT 28.6 (L) 36.0 - 46.0 %   MCV 87.2 78.0 - 100.0 fL   MCH 29.0 26.0 - 34.0 pg   MCHC 33.2 30.0 - 36.0 g/dL   RDW 13.9 11.5 - 15.5 %   Platelets 233 150 - 400 K/uL   Neutrophils Relative % 77 %   Neutro Abs 6.9 1.7 - 7.7 K/uL   Lymphocytes Relative 19 %   Lymphs Abs 1.8 0.7 - 4.0 K/uL   Monocytes Relative 4 %   Monocytes Absolute 0.4 0.1 - 1.0 K/uL   Eosinophils Relative 0 %   Eosinophils Absolute 0.0 0.0 - 0.7 K/uL   Basophils Relative 0 %   Basophils Absolute 0.0 0.0 - 0.1 K/uL  Comprehensive metabolic panel  Collection Time: 03/18/18   5:17 AM  Result Value Ref Range   Sodium 130 (L) 135 - 145 mmol/L   Potassium 3.7 3.5 - 5.1 mmol/L   Chloride 104 98 - 111 mmol/L   CO2 19 (L) 22 - 32 mmol/L   Glucose, Bld 114 (H) 70 - 99 mg/dL   BUN <5 (L) 6 - 20 mg/dL   Creatinine, Ser <0.30 (L) 0.44 - 1.00 mg/dL   Calcium 7.6 (L) 8.9 - 10.3 mg/dL   Total Protein 5.7 (L) 6.5 - 8.1 g/dL   Albumin 2.7 (L) 3.5 - 5.0 g/dL   AST 15 15 - 41 U/L   ALT 19 0 - 44 U/L   Alkaline Phosphatase 47 38 - 126 U/L   Total Bilirubin 0.9 0.3 - 1.2 mg/dL   GFR calc non Af Amer NOT CALCULATED >60 mL/min   GFR calc Af Amer NOT CALCULATED >60 mL/min   Anion gap 7 5 - 15    Future Appointments: No future appointments.  Discharge Condition: Stable  Disposition: Discharge disposition: 01-Home or Self Care       Discharge Instructions    Discharge activity:  No Restrictions   Complete by:  As directed    Discharge diet:  No restrictions   Complete by:  As directed    Fetal Kick Count:  Lie on our left side for one hour after a meal, and count the number of times your baby kicks.  If it is less than 5 times, get up, move around and drink some juice.  Repeat the test 30 minutes later.  If it is still less than 5 kicks in an hour, notify your doctor.   Complete by:  As directed    No sexual activity restrictions   Complete by:  As directed      Allergies as of 03/19/2018   No Known Allergies     Medication List    TAKE these medications   glycopyrrolate 1 MG tablet Commonly known as:  ROBINUL Take 1 tablet (1 mg total) by mouth 3 (three) times daily.   metoCLOPramide 10 MG tablet Commonly known as:  REGLAN Take 1 tablet (10 mg total) by mouth every 6 (six) hours as needed for nausea (nausea/headache).   ondansetron 4 MG disintegrating tablet Commonly known as:  ZOFRAN ODT Take 1 tablet (4 mg total) by mouth every 8 (eight) hours as needed for nausea or vomiting. When phenergan or reglan don't work   prenatal multivitamin Tabs  tablet Take 1 tablet by mouth daily at 12 noon.   promethazine 25 MG suppository Commonly known as:  PHENERGAN Place 1 suppository (25 mg total) rectally every 6 (six) hours as needed for nausea or vomiting. USE VAGINALLY AT BEDTIME   ranitidine 150 MG tablet Commonly known as:  ZANTAC Take 1 tablet (150 mg total) by mouth 2 (two) times daily.      Follow-up [place]. Schedule an appointment as soon as possible for a visit in 2 week(s).   Specialty:  Obstetrics and Gynecology Contact information: [address] [place] [phone]          Signed: Donnamae Jude M.D. 03/19/2018, 7:49 AM

## 2018-03-19 NOTE — Progress Notes (Signed)
All discharge teaching is complete. All instructions printed in the Spanish language and explained to the patient. A Spanish Interpreter was used during discharge teaching. Patient verbalizes an understanding of all instructions given and denies any questions at this time.

## 2018-03-22 ENCOUNTER — Telehealth: Payer: Self-pay | Admitting: General Practice

## 2018-03-22 NOTE — Telephone Encounter (Signed)
Called and notified patient of New OB appt (with interpreter) on 03/29/18 at 10:15am.  Patient verbalized understanding.

## 2018-03-28 ENCOUNTER — Encounter: Payer: Self-pay | Admitting: Student

## 2018-03-29 ENCOUNTER — Inpatient Hospital Stay (HOSPITAL_COMMUNITY)
Admission: AD | Admit: 2018-03-29 | Discharge: 2018-03-29 | Disposition: A | Discharge status: Home / Self Care | Payer: Self-pay | Source: Ambulatory Visit | Attending: Obstetrics and Gynecology | Admitting: Obstetrics and Gynecology

## 2018-03-29 ENCOUNTER — Ambulatory Visit (INDEPENDENT_AMBULATORY_CARE_PROVIDER_SITE_OTHER): Payer: Self-pay | Admitting: Student

## 2018-03-29 ENCOUNTER — Encounter (HOSPITAL_COMMUNITY): Payer: Self-pay

## 2018-03-29 ENCOUNTER — Inpatient Hospital Stay (HOSPITAL_BASED_OUTPATIENT_CLINIC_OR_DEPARTMENT_OTHER): Payer: Self-pay

## 2018-03-29 ENCOUNTER — Other Ambulatory Visit: Payer: Self-pay

## 2018-03-29 ENCOUNTER — Encounter: Payer: Self-pay | Admitting: Student

## 2018-03-29 DIAGNOSIS — R109 Unspecified abdominal pain: Secondary | ICD-10-CM | POA: Insufficient documentation

## 2018-03-29 DIAGNOSIS — O3412 Maternal care for benign tumor of corpus uteri, second trimester: Secondary | ICD-10-CM

## 2018-03-29 DIAGNOSIS — O26899 Other specified pregnancy related conditions, unspecified trimester: Secondary | ICD-10-CM

## 2018-03-29 DIAGNOSIS — O09522 Supervision of elderly multigravida, second trimester: Secondary | ICD-10-CM

## 2018-03-29 DIAGNOSIS — O219 Vomiting of pregnancy, unspecified: Secondary | ICD-10-CM

## 2018-03-29 DIAGNOSIS — Z789 Other specified health status: Secondary | ICD-10-CM | POA: Insufficient documentation

## 2018-03-29 DIAGNOSIS — O09892 Supervision of other high risk pregnancies, second trimester: Secondary | ICD-10-CM

## 2018-03-29 DIAGNOSIS — O09212 Supervision of pregnancy with history of pre-term labor, second trimester: Secondary | ICD-10-CM

## 2018-03-29 DIAGNOSIS — Z113 Encounter for screening for infections with a predominantly sexual mode of transmission: Secondary | ICD-10-CM

## 2018-03-29 DIAGNOSIS — Z3480 Encounter for supervision of other normal pregnancy, unspecified trimester: Secondary | ICD-10-CM

## 2018-03-29 DIAGNOSIS — D259 Leiomyoma of uterus, unspecified: Secondary | ICD-10-CM

## 2018-03-29 DIAGNOSIS — Z3A21 21 weeks gestation of pregnancy: Secondary | ICD-10-CM | POA: Insufficient documentation

## 2018-03-29 DIAGNOSIS — O0932 Supervision of pregnancy with insufficient antenatal care, second trimester: Secondary | ICD-10-CM

## 2018-03-29 DIAGNOSIS — Z603 Acculturation difficulty: Secondary | ICD-10-CM

## 2018-03-29 DIAGNOSIS — O212 Late vomiting of pregnancy: Secondary | ICD-10-CM | POA: Insufficient documentation

## 2018-03-29 DIAGNOSIS — O3413 Maternal care for benign tumor of corpus uteri, third trimester: Secondary | ICD-10-CM

## 2018-03-29 LAB — POCT URINALYSIS DIP (DEVICE)
Glucose, UA: NEGATIVE mg/dL
LEUKOCYTES UA: NEGATIVE
Nitrite: NEGATIVE
PROTEIN: 30 mg/dL — AB
Specific Gravity, Urine: 1.025 (ref 1.005–1.030)
UROBILINOGEN UA: 0.2 mg/dL (ref 0.0–1.0)
pH: 6.5 (ref 5.0–8.0)

## 2018-03-29 LAB — COMPREHENSIVE METABOLIC PANEL
ALBUMIN: 3.2 g/dL — AB (ref 3.5–5.0)
ALT: 22 U/L (ref 0–44)
ANION GAP: 11 (ref 5–15)
AST: 18 U/L (ref 15–41)
Alkaline Phosphatase: 64 U/L (ref 38–126)
BUN: 7 mg/dL (ref 6–20)
CO2: 24 mmol/L (ref 22–32)
Calcium: 8.4 mg/dL — ABNORMAL LOW (ref 8.9–10.3)
Chloride: 97 mmol/L — ABNORMAL LOW (ref 98–111)
Creatinine, Ser: 0.31 mg/dL — ABNORMAL LOW (ref 0.44–1.00)
GFR calc Af Amer: >60 mL/min (ref >60)
GFR calc non Af Amer: >60 mL/min (ref >60)
GLUCOSE: 92 mg/dL (ref 70–99)
POTASSIUM: 3.6 mmol/L (ref 3.5–5.1)
SODIUM: 132 mmol/L — AB (ref 135–145)
TOTAL PROTEIN: 6.7 g/dL (ref 6.5–8.1)
Total Bilirubin: 0.4 mg/dL (ref 0.3–1.2)

## 2018-03-29 LAB — CBC WITH DIFFERENTIAL/PLATELET
BASOS PCT: 0 %
Basophils Absolute: 0 10*3/uL (ref 0.0–0.1)
EOS ABS: 0 10*3/uL (ref 0.0–0.7)
Eosinophils Relative: 0 %
HCT: 34.8 % — ABNORMAL LOW (ref 36.0–46.0)
Hemoglobin: 11.5 g/dL — ABNORMAL LOW (ref 12.0–15.0)
Lymphocytes Relative: 9 %
Lymphs Abs: 1 10*3/uL (ref 0.7–4.0)
MCH: 28.5 pg (ref 26.0–34.0)
MCHC: 33 g/dL (ref 30.0–36.0)
MCV: 86.1 fL (ref 78.0–100.0)
Monocytes Absolute: 0.5 10*3/uL (ref 0.1–1.0)
Monocytes Relative: 4 %
NEUTROS ABS: 10.4 10*3/uL — AB (ref 1.7–7.7)
NEUTROS PCT: 87 %
Platelets: 239 10*3/uL (ref 150–400)
RBC: 4.04 MIL/uL (ref 3.87–5.11)
RDW: 13.9 % (ref 11.5–15.5)
WBC: 11.9 10*3/uL — ABNORMAL HIGH (ref 4.0–10.5)

## 2018-03-29 MED ORDER — IBUPROFEN 600 MG PO TABS: 600.0000 mg | ORAL_TABLET | Freq: Four times a day (QID) | ORAL | 0 refills | Status: DC | PRN

## 2018-03-29 MED ORDER — KETOROLAC TROMETHAMINE 30 MG/ML IJ SOLN
30.0000 mg | Freq: Once | INTRAMUSCULAR | Status: AC
  Administered 2018-03-29: 30 mg via INTRAMUSCULAR

## 2018-03-29 MED ORDER — HYDROMORPHONE HCL 1 MG/ML IJ SOLN
1.0000 mg | Freq: Once | INTRAMUSCULAR | Status: AC
  Administered 2018-03-29: 1 mg via INTRAMUSCULAR
  Filled 2018-03-29: qty 1

## 2018-03-29 MED ORDER — KETOROLAC TROMETHAMINE 30 MG/ML IJ SOLN
30.0000 mg | Freq: Once | INTRAMUSCULAR | Status: DC
Start: 2018-03-29 — End: 2018-03-29
  Filled 2018-03-29: qty 1

## 2018-03-29 NOTE — MAU Provider Note (Signed)
History     CSN: [id number]  Arrival date and time: 03/29/18 1122   First Provider Initiated Contact with Patient 03/29/18 1157      Chief Complaint  Patient presents with  . Contractions   HPI   Gabriela Morgan is a 37 y.o. female [phone] @ 57w4dhere in MAU with abdominal pain. [facility]; she was sent here for further evaluation of her abdominal pain. The abdominal pain started when she found out she was pregnant, however this morning the pain started to become stronger. The pain comes and goes. She is feeling the pain every 2-3 minutes. She currently rates her pain: 1/10.  The pain radiates around to her lower back. The pain is much less severe now after the pain medication that was given in MAU. +N/V however that has been present throughout the pregnancy.   OB History    Gravida  4   Para  3   Term  1   Preterm  2   AB  0   Living  3     SAB  0   TAB  0   Ectopic  0   Multiple  0   Live Births  3           Past Medical History:  Diagnosis Date  . Preterm labor   . Uterine fibroid     Past Surgical History:  Procedure Laterality Date  . [place]   Family History  Problem Relation Age of Onset  . Diabetes Mother   . Diabetes Brother     Social History   Tobacco Use  . Smoking status: Never Smoker  . Smokeless tobacco: Never Used  Substance Use Topics  . Alcohol use: Not Currently  . Drug use: Not Currently    Allergies: No Known Allergies  Medications Prior to Admission  Medication Sig Dispense Refill Last Dose  . glycopyrrolate (ROBINUL) 1 MG tablet Take 1 tablet (1 mg total) by mouth 3 (three) times daily. (Patient not taking: Reported on 03/29/2018) 90 tablet 0 Not Taking  . metoCLOPramide (REGLAN) 10 MG tablet Take 1 tablet (10 mg total) by mouth every 6 (six) hours as needed for nausea (nausea/headache). (Patient not taking: Reported on 03/29/2018) 6  tablet 0 Not Taking  . ondansetron (ZOFRAN ODT) 4 MG disintegrating tablet Take 1 tablet (4 mg total) by mouth every 8 (eight) hours as needed for nausea or vomiting. When phenergan or reglan don't work 20 tablet 0 Taking  . Prenatal Vit-Fe Fumarate-FA (PRENATAL MULTIVITAMIN) TABS tablet Take 1 tablet by mouth daily at 12 noon.   Not Taking  . promethazine (PHENERGAN) 25 MG suppository Place 1 suppository (25 mg total) rectally every 6 (six) hours as needed for nausea or vomiting. USE VAGINALLY AT BEDTIME (Patient not taking: Reported on 03/29/2018) 12 each 3 Not Taking  . ranitidine (ZANTAC) 150 MG tablet Take 1 tablet (150 mg total) by mouth 2 (two) times daily. 60 tablet 0 Taking   Results for orders placed or performed during the hospital encounter of 03/29/18 (from the past 48 hour(s))  CBC with Differential     Status: Abnormal   Collection Time: 03/29/18 12:41 PM  Result Value Ref Range   WBC 11.9 (H) 4.0 - 10.5 K/uL   RBC 4.04 3.87 - 5.11 MIL/uL   Hemoglobin 11.5 (L) 12.0 - 15.0 g/dL   HCT 34.8 (L) 36.0 -  46.0 %   MCV 86.1 78.0 - 100.0 fL   MCH 28.5 26.0 - 34.0 pg   MCHC 33.0 30.0 - 36.0 g/dL   RDW 13.9 11.5 - 15.5 %   Platelets 239 150 - 400 K/uL   Neutrophils Relative % 87 %   Neutro Abs 10.4 (H) 1.7 - 7.7 K/uL   Lymphocytes Relative 9 %   Lymphs Abs 1.0 0.7 - 4.0 K/uL   Monocytes Relative 4 %   Monocytes Absolute 0.5 0.1 - 1.0 K/uL   Eosinophils Relative 0 %   Eosinophils Absolute 0.0 0.0 - 0.7 K/uL   Basophils Relative 0 %   Basophils Absolute 0.0 0.0 - 0.1 K/uL    Comment: Performed at [facility], [address], [place], [place] [address]  Comprehensive metabolic panel     Status: Abnormal   Collection Time: 03/29/18 12:41 PM  Result Value Ref Range   Sodium 132 (L) 135 - 145 mmol/L   Potassium 3.6 3.5 - 5.1 mmol/L   Chloride 97 (L) 98 - 111 mmol/L    Comment: Please note change in reference range.   CO2 24 22 - 32 mmol/L   Glucose, Bld 92 70 - 99 mg/dL     Comment: Please note change in reference range.   BUN 7 6 - 20 mg/dL    Comment: Please note change in reference range.   Creatinine, Ser 0.31 (L) 0.44 - 1.00 mg/dL   Calcium 8.4 (L) 8.9 - 10.3 mg/dL   Total Protein 6.7 6.5 - 8.1 g/dL   Albumin 3.2 (L) 3.5 - 5.0 g/dL   AST 18 15 - 41 U/L   ALT 22 0 - 44 U/L    Comment: Please note change in reference range.   Alkaline Phosphatase 64 38 - 126 U/L   Total Bilirubin 0.4 0.3 - 1.2 mg/dL   GFR calc non Af Amer >60 >60 mL/min   GFR calc Af Amer >60 >60 mL/min    Comment: (NOTE) The eGFR has been calculated using the CKD EPI equation. This calculation has not been validated in all clinical situations. eGFR's persistently <60 mL/min signify possible Chronic Kidney Disease.    Anion gap 11 5 - 15    Comment: Performed at [facility], [address] [place], [place] [address]   [place] Mfm Ob Limited  Result Date: 03/29/2018 ----------------------------------------------------------------------  OBSTETRICS REPORT                      (Signed Final 03/29/2018 03:18 pm) ---------------------------------------------------------------------- Patient Info  ID #:       [id number]                          D.O.B.:  1981-03-05 (36 yrs)  Name:       Gabriela Morgan                Visit Date: 03/29/2018 01:36 pm ---------------------------------------------------------------------- Performed By  Performed By:     Rodrigo Ran BS      Referred By:      MAU Nursing-                    RDMS RVT                                 MAU/Triage  Attending:        Einar Grad  Donalee Citrin MD        Location:         [facility] ---------------------------------------------------------------------- Orders   #  Description                                 Code   1  [place] MFM OB LIMITED                           [phone]  ----------------------------------------------------------------------   #  Ordered By               Order #        Accession #    Episode #   1  Noni Saupe            [id number]      [id number]     [id number]  ---------------------------------------------------------------------- Indications   [redacted] weeks gestation of pregnancy                Z3A.21   Late prenatal care, second trimester           O09.32   Pelvic pain affecting pregnancy in second      O26.892   trimester   Uterine fibroids affecting pregnancy in        O34.12, D25.9   second trimester, antepartum   Advanced maternal age multigravida 54+,        O33.522   second trimester   Poor obstetric history: Previous preterm       O09.219   delivery, antepartum x 2  ---------------------------------------------------------------------- OB History  Blood Type:            Height:  5'4"   Weight (lb):  125       BMI:  21.45  Gravidity:    4         Term:   2        Prem:   1        SAB:   0  TOP:          0       Ectopic:  0        Living: 3 ---------------------------------------------------------------------- Fetal Evaluation  Num Of Fetuses:     1  Fetal Heart         150  Rate(bpm):  Cardiac Activity:   Observed  Presentation:       Breech  Placenta:           Anterior, above cervical os  P. Cord Insertion:  Visualized  Amniotic Fluid  AFI FV:      Subjectively within normal limits                              Largest Pocket(cm)                              5.66 ---------------------------------------------------------------------- Gestational Age  LMP:           18w 4d        Date:  11/19/17                 EDD:   08/26/18  Best:          Audrea Muscat 4d     Det. By:  U/S  (03/17/18)  EDD:   08/05/18 ---------------------------------------------------------------------- Cervix Uterus Adnexa  Cervix  Length:            3.3  cm.  Normal appearance by transabdominal scan.  Uterus  Single fibroid noted, see table below.  Left Ovary  Within normal limits.  Right Ovary  Within normal limits.  Cul De Sac:   No free fluid seen.  Adnexa:       No abnormality visualized.  ---------------------------------------------------------------------- Myomas   Site                     L(cm)      W(cm)      D(cm)      Location   Anterior Left            6.5        6.4        5.2        Intramural  ----------------------------------------------------------------------   Blood Flow                 RI        PI       Comments  ---------------------------------------------------------------------- Impression  Patient is being evaluated for abdominal pain.  A limited ultrasound study was performed. Amniotic fluid is  normal and good fetal activity is seen. Placenta is anterior  and appears normal. On transabdominal scan, the cervix  measures 3.3 cm, which is normal. 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 ---------------------------------------------------------------------- Recommendations  Clinical correlation recommended. ----------------------------------------------------------------------                  Tama High, MD Electronically Signed Final Report   03/29/2018 03:18 pm ----------------------------------------------------------------------  Review of Systems  Constitutional: Negative for fever.  Gastrointestinal: Positive for abdominal pain and nausea. Negative for diarrhea and vomiting.  Genitourinary: Negative for dysuria.   Physical Exam   Blood pressure 106/65, pulse 82, temperature 97.9 F (36.6 C), temperature source Oral, resp. rate 18, weight 124 lb 8 oz (56.5 kg), last menstrual period 11/19/2017, SpO2 100 %.  Physical Exam  Constitutional: She is oriented to person, place, and time. She appears well-developed and well-nourished. She appears distressed.  Respiratory: Effort normal.  GI: Soft. Normal appearance. She exhibits distension. She exhibits no mass. There is tenderness in the right lower quadrant, periumbilical area and suprapubic area. There is no rebound and no guarding.  Musculoskeletal: Normal range of motion.   Neurological: She is alert and oriented to person, place, and time.  Skin: Skin is warm. She is not diaphoretic.  Psychiatric: Her behavior is normal.    MAU Course  Procedures  None  MDM  + fetal heart tones via doppler.  Dilaudid 1 mg given IM Bedside limited US shows normal cervical length > 3 cm Previous US shows > 6 cm fibroid, Patient without rebound tenderness or fever, unlikely appendicitis although would consider further imaging if patient has further ED visits.  Patient rates pain 0/10 at this time.  Discussed patient with Dr. Rip Harbour; discussed HPI, exam, labs and [place]. Reserve for DC home with ibuprofen and close f/u in the office.  Toradol 30 mg IM given prior to DC   Assessment and Plan   A:  1. Uterine fibroids affecting pregnancy in second trimester   2. Nausea and vomiting during pregnancy prior to [redacted] weeks gestation   3. Abdominal pain in pregnancy   4. [redacted] weeks gestation of pregnancy     P:  Discharge home in stable condition Rx: ibuprofen X 3 days, patient need narcotics if pain persists.  Return to MAU if symptoms worsen Message sent to the [place] to schedule OB visit in 2 weeks.  Patient will also be called for 17P   Rasch, Artist Pais, NP 03/29/2018 3:49 PM

## 2018-03-29 NOTE — Patient Instructions (Signed)
Plan de alimentacin para la hiperemesis gravdica (Eating Plan for Hyperemesis Gravidarum) La hiperemesis gravdica es una forma grave de nuseas matinales. Debido a que esta afeccin provoca nuseas y vmitos intensos, puede producir deshidratacin, desnutricin y prdida de [place]. Una forma de [employer] nuseas y los vmitos es seguir un plan de alimentacin para la hiperemesis gravdica. A menudo se utiliza con medicamentos recetados para controlar los sntomas. QU PUEDO HACER PARA ALIVIAR MIS SNTOMAS? Prstele atencin a su cuerpo. Todas las personas son diferentes y Merchandiser, retail. Descubra qu funciona mejor para usted. Tome cualquiera de las siguientes medidas que le sern tiles:  Coma y beba lentamente.  Realice 5 o 6comidas pequeas por da en vez de tres grandes.  Consuma galletitas saladas antes de levantarse por la maana.  Pruebe con un refrigerio en mitad de la noche.  Los alimentos con almidn suelen [employer]. Los ejemplos incluyen cereales, tostadas, pan, papas, pasta, arroz y pretzels.  El jengibre tambin es til para Product/process development scientist las nuseas. Aada  de cucharadita de jengibre molido al t caliente o beba t de jengibre.  Intente tomar jugo 100% de frutas o una bebida de electrolitos. Una bebida de electrolitos contiene sodio, potasio y cloruro.  Contine tomando las vitaminas prenatales segn las indicaciones del mdico. Si tiene problemas para tomas sus vitaminas prenatales, hable con su mdico sobre las diferentes opciones.  Incluya al menos una porcin de protenas en las comidas y [place]. Las opciones de protenas incluyen carne o carne de ave, frijoles, nueces, huevos y Estate agent. Trate de consumir un refrigerio rico en protenas antes de acostarse. Los ejemplos de refrigerios incluyen galletas y [place] o medio sndwich de [place] o de [place] de man.  Considere eliminar alimentos que desencadenan sus sntomas. Estos pueden incluir caf y  alimentos condimentados, con alto contenido graso, muy dulces y cidos.  Pruebe con comidas que tengan ms protenas combinadas con alimentos blandos, salados, desecados y de bajo contenido graso, como nueces, semillas, pretzels, galletas y cereales.  Hable con su mdico sobre el uso de cualquier suplemento de vitaminaB6.  Tome lquidos que sean fros, transparentes y carbonatados o agrios. Los ejemplos [place], gaseosa de [place], gaseosa de [place], agua helada y soda.  Pruebe beber t con limn o menta.  Cepllese los dientes o use un enjuague bucal despus de las comidas. QU DEBO EVITAR PARA REDUCIR MIS SNTOMAS? Evitar algunas de las siguientes opciones puede ser de [place] para reducir sus sntomas.  Alimentos con [employer]. Intente comer en reas bien ventiladas libres de olores.  Tomar agua u otras bebidas con las comidas. Intente no beber nada 49minutos antes y despus de las comidas.  Beber ms de una taza de lquidos a la vez. A veces, el uso de un sorbete puede ser de [place].  Comidas fritas o con gran contenido de grasas, como salsas con [place] y crema.  Comidas muy condimentadas.  Saltear comidas. Las nuseas pueden ser ms intensas con el estmago vaco. Si no puede [employer] momento, no se obligue. Intente chupar trozos de hielo u otros elementos congelados e ingiera las caloras perdidas ms tarde.  Recostarse [employer] horas siguientes a comer.  Desencadenantes ambientales. Estos pueden incluir habitaciones con humo, espacios cerrados, habitaciones con olores fuertes, lugares clidos o hmedos, habitaciones muy ruidosas y habitaciones con luces mviles o parpadeantes.  Cambios rpidos y repentinos en su movimiento. Esta informacin no tiene Marine scientist el consejo del mdico. Asegrese de hacerle  al mdico cualquier pregunta que tenga. Document Released: 12/24/2008 Document Revised: 09/12/2013 Document Reviewed:  04/07/2016 Elsevier Interactive Patient Education  [employer].

## 2018-03-29 NOTE — Progress Notes (Signed)
Subjective:   Gabriela Morgan is a 37 y.o. [id number] at [redacted]w[redacted]d by 19 wk ultrasound, being seen today for her first obstetrical visit.  Her obstetrical history is significant for advanced maternal age, hx of preterm delivery x 2, and uterine fibroid. First pregnancy was term delivery. Subsequent pregnancies for preterm, pt thinks she was 7-8 months. Her baby in 2010 weighed 4 1/2 lbs & her last baby weighed 3 lbs. In both cases she went into spontaneous labor. Pregnancy history fully reviewed. She was admitted to the hospital last week for abdominal pain and HEG. Abdominal pain believed to be due to her uterine fibroid. While in house she had an anatomy scan; normal anatomy with CL of 3 cm. Recommended f/u for growth. She reports that she continues to vomit several times per day and that she takes her nausea medications but unsure which once she takes. Was prescribed zofran, phenergan, reglan, robinul, and zantac.   Patient reports contractions since this morning occurring every 2-3 minutes.Marland Kitchen  HISTORY: OB History  Gravida Para Term Preterm AB Living  4 3 1 2  0 3  SAB TAB Ectopic Multiple Live Births  0 0 0 0 3    # Outcome Date GA Lbr Len/2nd Weight Sex Delivery Anes PTL Lv  4 Current           3 Preterm 2014   3 lb (1.361 kg) M    LIV  2 Preterm 2010   4 lb 8 oz (2.041 kg) F Vag-Spont   LIV  1 Term 2005   7 lb 8 oz (3.402 kg) M Vag-Spont   LIV   Past Medical History:  Diagnosis Date  . Preterm labor   . Uterine fibroid    Past Surgical History:  Procedure Laterality Date  . APPEN[place]CTOMY  2015   [place]   Family History  Problem Relation Age of Onset  . Diabetes Mother   . Diabetes Brother    Social History   Tobacco Use  . Smoking status: Never Smoker  . Smokeless tobacco: Never Used  Substance Use Topics  . Alcohol use: Not Currently  . Drug use: Not Currently   No Known Allergies Current Outpatient Medications on File Prior to Visit  Medication Sig Dispense  Refill  . ondansetron (ZOFRAN ODT) 4 MG disintegrating tablet Take 1 tablet (4 mg total) by mouth every 8 (eight) hours as needed for nausea or vomiting. When phenergan or reglan don't work 20 tablet 0  . ranitidine (ZANTAC) 150 MG tablet Take 1 tablet (150 mg total) by mouth 2 (two) times daily. 60 tablet 0  . glycopyrrolate (ROBINUL) 1 MG tablet Take 1 tablet (1 mg total) by mouth 3 (three) times daily. (Patient not taking: Reported on 03/29/2018) 90 tablet 0  . metoCLOPramide (REGLAN) 10 MG tablet Take 1 tablet (10 mg total) by mouth every 6 (six) hours as needed for nausea (nausea/headache). (Patient not taking: Reported on 03/29/2018) 6 tablet 0  . Prenatal Vit-Fe Fumarate-FA (PRENATAL MULTIVITAMIN) TABS tablet Take 1 tablet by mouth daily at 12 noon.    . promethazine (PHENERGAN) 25 MG suppository Place 1 suppository (25 mg total) rectally every 6 (six) hours as needed for nausea or vomiting. USE VAGINALLY AT BEDTIME (Patient not taking: Reported on 03/29/2018) 12 each 3   No current facility-administered medications on file prior to visit.     Exam   Vitals:   03/29/18 1040  BP: 115/83  Pulse: 98  Weight: 123  lb 11.2 oz (56.1 kg)   Fetal Heart Rate (bpm): 156  Uterus:    ctx palpate mild  Pelvic Exam: Perineum: no hemorrhoids, normal perineum   Vulva: normal external genitalia, no lesions   Cervix:  SVE 0.5-1/thick/ballotable  System: General: Pt appears uncomfortable   Neurologic: oriented, normal, negative, normal mood   Neck supple and no masses   Cardiovascular: regular rate and rhythm   Respiratory:  no respiratory distress, normal breath sounds     Assessment:   Pregnancy: [id number] Patient Active Problem List   Diagnosis Date Noted  . Supervision of other normal pregnancy, antepartum 03/29/2018  . Limited prenatal care in second trimester 03/29/2018  . Uterine fibroids affecting pregnancy in second trimester 03/29/2018  . Language barrier 03/29/2018  . Hx of preterm  delivery, currently pregnant, second trimester 03/29/2018  . AMA (advanced maternal age) multigravida 45+, second trimester 03/29/2018  . Hyperemesis affecting pregnancy, antepartum 03/18/2018  . Nausea and vomiting during pregnancy prior to [redacted] weeks gestation 02/22/2018     Plan:  1. Supervision of other normal pregnancy, antepartum -Unable to complete initial prenatal appt today d/t patient discomfort. Reports intermittent abd pain every 2-3 minutes since this morning and appears to be having contractions. Is having trouble sitting still on table and answering questions d/t pain. Cervix is dilated. Sending to MAU for further evaluation and treatment.  -If patient returns for OB visit, needs initial labs, discussion regarding 17-P, and genetic counseling appt d/t AMA.   2. Limited prenatal care in second trimester   3. Uterine fibroids affecting pregnancy in second trimester   4. Language barrier -video Spanish interpreter used  5. Hx of preterm delivery, currently pregnant, second trimester -Should be offered 17-P  6. AMA (advanced maternal age) multigravida 10+, second trimester -needs genetic counseling appt    Jorje Guild 11:17 AM 03/29/18

## 2018-03-29 NOTE — Discharge Instructions (Signed)
Dolor abdominal durante el embarazo (Abdominal Pain During Pregnancy) El dolor de vientre (abdominal) es habitual durante el embarazo. Generalmente no se trata de un problema grave. Otras veces puede ser un signo de que algo no anda bien. Siempre comunquese con su mdico si tiene dolor abdominal. CUIDADOS EN EL HOGAR Controle el dolor para ver si hay cambios. Las indicaciones que siguen pueden ayudarla a sentirse mejor:  Optician, dispensing (relaciones sexuales) ni se coloque nada dentro de la vagina hasta que se sienta mejor.  Haga reposo hasta que el dolor se calme.  Si siente ganas de vomitar (nuseas ) beba lquidos claros. No consuma alimentos slidos hasta que se sienta mejor.  Slo tome los medicamentos que le haya indicado su mdico.  Cumpla con las visitas al mdico segn las indicaciones. SOLICITE AYUDA DE INMEDIATO SI:  Tiene un sangrado, pierde lquido o elimina trozos de tejido por la vagina.  Siente ms dolor o clicos.  Comienza a vomitar.  Siente dolor al orinar u observa sangre en la orina.  Tiene fiebre.  No siente que el beb se mueva mucho.  Se siente muy dbil o cree que va a desmayarse.  Tiene dificultad para respirar con o sin dolor en el vientre.  Siente un dolor de cabeza muy intenso y Social research officer, government en el vientre.  Observa que sale un lquido por la vagina y tiene dolor abdominal.  La materia fecal es lquida (diarrea).  El dolor en el viente no desaparece, o empeora, luego de hacer reposo. ASEGRESE DE QUE:  Comprende estas instrucciones.  Controlar su afeccin.  Recibir ayuda de inmediato si no mejora o si empeora. Esta informacin no tiene Marine scientist el consejo del mdico. Asegrese de hacerle al mdico cualquier pregunta que tenga. Document Released: 05/20/2011 Document Revised: 12/30/2015 Document Reviewed: 04/06/2013 Elsevier Interactive Patient Education  2018 [place] uterinos (Uterine Fibroids) Los fibromas uterinos  son masas (tumores) de tejido que pueden desarrollarse en el vientre (tero). Tambin se los [employer]. Este tipo de tumor no es Radio broadcast assistant (benigno) y no se disemina a Airline pilot del cuerpo fuera de la zona plvica, la cual se encuentra entre los huesos de la cadera. En ocasiones, los fibromas pueden crecer en las trompas de [place], en el cuello del tero o en las estructuras de soporte (ligamentos) que rodean el tero. Una mujer puede tener uno o ms fibromas. Los fibromas pueden tener diferente tamao y [place], y crecer en distintas partes del tero. Algunos pueden crecer hasta volverse bastante grandes. La mayora no requiere tratamiento mdico. CAUSAS Un fibroma puede desarrollarse cuando una nica clula uterina contina creciendo (se multiplica). La mayora de las clulas del cuerpo humano tienen un mecanismo de control que impide que se multipliquen sin control. [place] los sntomas se pueden incluir los siguientes:  Hemorragias intensas durante la menstruacin.  Prdidas de sangre o [employer].  Dolor y opresin en la pelvis.  Problemas de la vejiga, como necesidad de Garment/textile technologist con ms frecuencia (polaquiuria) o necesidad imperiosa de Garment/textile technologist.  Incapacidad para reproducir (infertilidad).  Abortos espontneos. DIAGNSTICO Los fibromas uterinos se diagnostican con un examen fsico. El mdico puede palpar los tumores grumosos durante un examen plvico. Pueden realizarse ecografas y Ardelia Mems resonancia magntica para determinar el tamao y la ubicacin de los fibromas, as como la cantidad. TRATAMIENTO El tratamiento puede incluir lo siguiente:  Observacin cautelosa. Esto requiere que el mdico controle el fibroma para saber si crece o se achica.  Siga las recomendaciones del mdico respecto de la frecuencia con la que debe realizarse los controles.  Medicamentos hormonales. Pueden tomarse por va oral o administrarse a travs de un dispositivo  intrauterino (DIU).  Ciruga. ? Extirpacin de los fibromas (miomectoma) o del tero (histerectoma). ? Suprimir la irrigacin sangunea a los fibromas (embolizacin de la arteria uterina). Si los fibromas le traen problemas de fertilidad y tiene deseos de quedar Gabbs, el mdico puede recomendar su extirpacin. INSTRUCCIONES PARA EL CUIDADO EN EL HOGAR  Concurra a todas las visitas de control como se lo haya indicado el mdico. Esto es importante.  Tome los medicamentos de venta libre y los recetados solamente como se lo haya indicado el mdico. ? Si le recetaron un tratamiento hormonal, tome los medicamentos hormonales exactamente como se lo indicaron.  Consulte al [employer] sobre tomar comprimidos de hierro y Garment/textile technologist la cantidad de verduras de hoja color verde oscuro en la dieta. Estas medidas pueden ayudar a Transport planner de hierro en la [place], que pueden verse afectados por las hemorragias menstruales intensas.  Preste mucha atencin a la menstruacin e informe al mdico si hay algn cambio, por ejemplo: ? Aumento del flujo de sangre que le exige el uso de ms compresas o tampones que los que utiliza normalmente cada mes. ? Un cambio en la cantidad de [employer] dura la menstruacin cada mes. ? Un cambio en los sntomas asociados con la [place], como clicos abdominales o dolor de espalda. SOLICITE ATENCIN MDICA SI:  Tiene dolor plvico, dolor de espalda o clicos abdominales que los medicamentos no Engineer, petroleum.  Observa un aumento del sangrado entre y [employer].  Empapa los tampones o las compresas en el trmino de media hora o [place].  Se siente mareada, muy cansada o dbil. SOLICITE ATENCIN MDICA DE INMEDIATO SI:  Se desmaya.  El dolor plvico aumenta repentinamente. Esta informacin no tiene Marine scientist el consejo del mdico. Asegrese de hacerle al mdico cualquier pregunta que tenga. Document Released:  09/07/2005 Document Revised: 12/30/2015 Document Reviewed: 03/06/2014 Elsevier Interactive Patient Education  Henry [employer]in.

## 2018-03-29 NOTE — Progress Notes (Signed)
Pt states having sharpe pains in stomach & back. Having a lot of nausea.

## 2018-03-29 NOTE — MAU Provider Note (Signed)
History     CSN: [id number]  Arrival date and time: 03/29/18 1122   First Provider Initiated Contact with Patient 03/29/18 1157      Chief Complaint  Patient presents with  . Contractions   HPI   [name] is a 37 year old [phone] [redacted]w[redacted]d who presents after having an appointment today at the [facility] for experiencing lower back and abdominal pain. She has a past medical history significant for a uterine fibroid. She states this pain has been occurring on and off for several days and that she has had several bouts of this pain occurring throughout her current pregnancy. She describes the pain as stabbing and a 10/10 when she arrived at MAU. She has not tried anything to help mitigate the pain.   OB History    Gravida  4   Para  3   Term  1   Preterm  2   AB  0   Living  3     SAB  0   TAB  0   Ectopic  0   Multiple  0   Live Births  3           Past Medical History:  Diagnosis Date  . Preterm labor   . Uterine fibroid     Past Surgical History:  Procedure Laterality Date  . APPENDECTOMY  2015   in [place]    Family History  Problem Relation Age of Onset  . Diabetes Mother   . Diabetes Brother     Social History   Tobacco Use  . Smoking status: Never Smoker  . Smokeless tobacco: Never Used  Substance Use Topics  . Alcohol use: Not Currently  . Drug use: Not Currently    Allergies: No Known Allergies  Medications Prior to Admission  Medication Sig Dispense Refill Last Dose  . glycopyrrolate (ROBINUL) 1 MG tablet Take 1 tablet (1 mg total) by mouth 3 (three) times daily. (Patient not taking: Reported on 03/29/2018) 90 tablet 0 Not Taking  . metoCLOPramide (REGLAN) 10 MG tablet Take 1 tablet (10 mg total) by mouth every 6 (six) hours as needed for nausea (nausea/headache). (Patient not taking: Reported on 03/29/2018) 6 tablet 0 Not Taking  . ondansetron (ZOFRAN ODT) 4 MG disintegrating tablet Take 1 tablet (4 mg total) by mouth every 8  (eight) hours as needed for nausea or vomiting. When phenergan or reglan don't work 20 tablet 0 Taking  . Prenatal Vit-Fe Fumarate-FA (PRENATAL MULTIVITAMIN) TABS tablet Take 1 tablet by mouth daily at 12 noon.   Not Taking  . promethazine (PHENERGAN) 25 MG suppository Place 1 suppository (25 mg total) rectally every 6 (six) hours as needed for nausea or vomiting. USE VAGINALLY AT BEDTIME (Patient not taking: Reported on 03/29/2018) 12 each 3 Not Taking  . ranitidine (ZANTAC) 150 MG tablet Take 1 tablet (150 mg total) by mouth 2 (two) times daily. 60 tablet 0 Taking    Review of Systems  Constitutional: Negative for chills, fatigue and fever.  Respiratory: Negative for shortness of breath.   Cardiovascular: Negative for chest pain, palpitations and leg swelling.  Gastrointestinal: Positive for abdominal pain.       Not currently experiencing diarrhea, nausea, and vomiting.  Genitourinary: Negative for difficulty urinating, dyspareunia, dysuria, flank pain, hematuria, vaginal bleeding and vaginal discharge.  Musculoskeletal: Positive for back pain.  Skin: Negative.   Neurological: Positive for dizziness.   Physical Exam   Blood pressure 121/73, pulse (!) 103, temperature  97.9 F (36.6 C), temperature source Oral, resp. rate 18, weight 56.5 kg (124 lb 8 oz), last menstrual period 11/19/2017, SpO2 100 %.  Physical Exam  Constitutional: She appears well-developed. She appears distressed.  HENT:  Head: Normocephalic.  Cardiovascular: Normal rate, regular rhythm and normal heart sounds.  Respiratory: Breath sounds normal.  GI: Soft. There is tenderness. There is guarding.  Tenderness to suprapubic palpation  Skin: Skin is warm and dry.     MAU Course   MDM  [place] MFM Ob Limited Impression  Patient is being evaluated for abdominal pain.  A limited ultrasound study was performed. Amniotic fluid is  normal and good fetal activity is seen. Placenta is anterior  and appears normal. On  transabdominal scan, the cervix  measures 3.3 cm, which is normal. 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 Recommendations  Clinical correlation recommended.  Dilaudid 1 mg IM once Toradol 30mg  IM once -Per patient pain went from a 10/10 to a 1/10   Assessment and Plan  Abdominal pain during pregnancy, second trimester  Patient to take ibuprofen 600 mg as needed Follow up in 2 weeks with the [facility] Patient advised to return to MAU if symptoms worsen  Sherie Don 03/29/2018, 12:13 PM

## 2018-03-29 NOTE — MAU Note (Signed)
Pt sent to MAU from office secondary having ctxs.  Denies VB.  Reports +FM.

## 2018-03-30 LAB — CERVICOVAGINAL ANCILLARY ONLY
Chlamydia: NEGATIVE
Neisseria Gonorrhea: NEGATIVE

## 2018-04-02 ENCOUNTER — Inpatient Hospital Stay (HOSPITAL_BASED_OUTPATIENT_CLINIC_OR_DEPARTMENT_OTHER): Payer: Self-pay

## 2018-04-02 ENCOUNTER — Inpatient Hospital Stay (HOSPITAL_COMMUNITY)
Admission: AD | Admit: 2018-04-02 | Discharge: 2018-04-02 | Disposition: A | Discharge status: Home / Self Care | Payer: Self-pay | Attending: Obstetrics & Gynecology | Admitting: Obstetrics & Gynecology

## 2018-04-02 ENCOUNTER — Other Ambulatory Visit: Payer: Self-pay

## 2018-04-02 ENCOUNTER — Encounter (HOSPITAL_COMMUNITY): Payer: Self-pay

## 2018-04-02 DIAGNOSIS — D259 Leiomyoma of uterus, unspecified: Secondary | ICD-10-CM | POA: Insufficient documentation

## 2018-04-02 DIAGNOSIS — Z791 Long term (current) use of non-steroidal anti-inflammatories (NSAID): Secondary | ICD-10-CM | POA: Insufficient documentation

## 2018-04-02 DIAGNOSIS — Z79899 Other long term (current) drug therapy: Secondary | ICD-10-CM | POA: Insufficient documentation

## 2018-04-02 DIAGNOSIS — O4702 False labor before 37 completed weeks of gestation, second trimester: Secondary | ICD-10-CM

## 2018-04-02 DIAGNOSIS — O09212 Supervision of pregnancy with history of pre-term labor, second trimester: Secondary | ICD-10-CM | POA: Insufficient documentation

## 2018-04-02 DIAGNOSIS — O3412 Maternal care for benign tumor of corpus uteri, second trimester: Secondary | ICD-10-CM | POA: Insufficient documentation

## 2018-04-02 DIAGNOSIS — O09522 Supervision of elderly multigravida, second trimester: Secondary | ICD-10-CM | POA: Insufficient documentation

## 2018-04-02 DIAGNOSIS — Z833 Family history of diabetes mellitus: Secondary | ICD-10-CM | POA: Insufficient documentation

## 2018-04-02 DIAGNOSIS — O0932 Supervision of pregnancy with insufficient antenatal care, second trimester: Secondary | ICD-10-CM | POA: Insufficient documentation

## 2018-04-02 DIAGNOSIS — O99282 Endocrine, nutritional and metabolic diseases complicating pregnancy, second trimester: Secondary | ICD-10-CM | POA: Insufficient documentation

## 2018-04-02 DIAGNOSIS — E860 Dehydration: Secondary | ICD-10-CM | POA: Insufficient documentation

## 2018-04-02 DIAGNOSIS — O219 Vomiting of pregnancy, unspecified: Secondary | ICD-10-CM

## 2018-04-02 DIAGNOSIS — Z3A22 22 weeks gestation of pregnancy: Secondary | ICD-10-CM

## 2018-04-02 DIAGNOSIS — O212 Late vomiting of pregnancy: Secondary | ICD-10-CM | POA: Insufficient documentation

## 2018-04-02 DIAGNOSIS — R109 Unspecified abdominal pain: Secondary | ICD-10-CM | POA: Insufficient documentation

## 2018-04-02 DIAGNOSIS — Z9889 Other specified postprocedural states: Secondary | ICD-10-CM | POA: Insufficient documentation

## 2018-04-02 DIAGNOSIS — O26892 Other specified pregnancy related conditions, second trimester: Secondary | ICD-10-CM | POA: Insufficient documentation

## 2018-04-02 LAB — URINALYSIS, ROUTINE W REFLEX MICROSCOPIC
Bilirubin Urine: NEGATIVE
GLUCOSE, UA: NEGATIVE mg/dL
Ketones, ur: 80 mg/dL — AB
Leukocytes, UA: NEGATIVE
NITRITE: NEGATIVE
PH: 5 (ref 5.0–8.0)
PROTEIN: 30 mg/dL — AB
Specific Gravity, Urine: 1.021 (ref 1.005–1.030)

## 2018-04-02 LAB — WET PREP, GENITAL
CLUE CELLS WET PREP: NONE SEEN
Sperm: NONE SEEN
Trich, Wet Prep: NONE SEEN
WBC WET PREP: NONE SEEN
Yeast Wet Prep HPF POC: NONE SEEN

## 2018-04-02 MED ORDER — PROMETHAZINE HCL 25 MG/ML IJ SOLN
25.0000 mg | Freq: Once | INTRAMUSCULAR | Status: DC
  Filled 2018-04-02: qty 1

## 2018-04-02 MED ORDER — DEXTROSE 5 % IN LACTATED RINGERS IV BOLUS: 1000.0000 mL | Freq: Once | INTRAVENOUS | Status: DC

## 2018-04-02 MED ORDER — HYDROMORPHONE HCL 1 MG/ML IJ SOLN
0.5000 mg | Freq: Once | INTRAMUSCULAR | Status: DC
  Filled 2018-04-02: qty 1

## 2018-04-02 MED ORDER — LACTATED RINGERS IV BOLUS: 1000.0000 mL | Freq: Once | INTRAVENOUS | Status: DC

## 2018-04-02 MED ORDER — HYDROMORPHONE HCL 1 MG/ML IJ SOLN
0.5000 mg | Freq: Once | INTRAMUSCULAR | Status: AC
  Administered 2018-04-02: 0.5 mg via INTRAMUSCULAR

## 2018-04-02 MED ORDER — PROGESTERONE MICRONIZED 200 MG PO CAPS: ORAL_CAPSULE | ORAL | 3 refills | Status: DC

## 2018-04-02 MED ORDER — ONDANSETRON HCL 4 MG/2ML IJ SOLN: 4.0000 mg | Freq: Once | INTRAMUSCULAR | Status: DC

## 2018-04-02 MED ORDER — HYDROMORPHONE HCL 1 MG/ML IJ SOLN: 1.0000 mg | Freq: Once | INTRAMUSCULAR | Status: DC

## 2018-04-02 MED ORDER — PROMETHAZINE HCL 25 MG/ML IJ SOLN
25.0000 mg | Freq: Once | INTRAMUSCULAR | Status: AC
  Administered 2018-04-02: 25 mg via INTRAMUSCULAR

## 2018-04-02 NOTE — MAU Note (Signed)
+  contractions Started early this morning. Constant and sharp  Pain 10/10. Has tried to take ibuprofen but states she has thrown up every time she tries to take something Denies LOF or VB +vaginal pressure Patient endorses having a cerclage  Vitals:   04/02/18 1052  BP: 112/81  Pulse: (!) 109  Resp: 18  Temp: 97.9 F (36.6 C)  SpO2: 100%

## 2018-04-02 NOTE — Discharge Instructions (Signed)
Plan de alimentacin para la hiperemesis gravdica (Eating Plan for Hyperemesis Gravidarum) La hiperemesis gravdica es una forma grave de nuseas matinales. Debido a que esta afeccin provoca nuseas y vmitos intensos, puede producir deshidratacin, desnutricin y prdida de [place]. Una forma de [employer] nuseas y los vmitos es seguir un plan de alimentacin para la hiperemesis gravdica. A menudo se utiliza con medicamentos recetados para controlar los sntomas. QU PUEDO HACER PARA ALIVIAR MIS SNTOMAS? Prstele atencin a su cuerpo. Todas las personas son diferentes y Merchandiser, retail. Descubra qu funciona mejor para usted. Tome cualquiera de las siguientes medidas que le sern tiles:  Coma y beba lentamente.  Realice 5 o 6comidas pequeas por da en vez de tres grandes.  Consuma galletitas saladas antes de levantarse por la maana.  Pruebe con un refrigerio en mitad de la noche.  Los alimentos con almidn suelen Kellogg. Los ejemplos incluyen cereales, tostadas, pan, papas, pasta, arroz y pretzels.  El jengibre tambin es til para Product/process development scientist las nuseas. Aada  de cucharadita de jengibre molido al t caliente o beba t de jengibre.  Intente tomar jugo 100% de frutas o una bebida de electrolitos. Una bebida de electrolitos contiene sodio, potasio y cloruro.  Contine tomando las vitaminas prenatales segn las indicaciones del mdico. Si tiene problemas para tomas sus vitaminas prenatales, hable con su mdico sobre las diferentes opciones.  Incluya al menos una porcin de protenas en las comidas y [place]. Las opciones de protenas incluyen carne o carne de ave, frijoles, nueces, huevos y Estate agent. Trate de consumir un refrigerio rico en protenas antes de acostarse. Los ejemplos de refrigerios incluyen galletas y [place] o medio sndwich de [place] o de [place] de man.  Considere eliminar alimentos que desencadenan sus sntomas. Estos pueden incluir caf y  alimentos condimentados, con alto contenido graso, muy dulces y cidos.  Pruebe con comidas que tengan ms protenas combinadas con alimentos blandos, salados, desecados y de bajo contenido graso, como nueces, semillas, pretzels, galletas y cereales.  Hable con su mdico sobre el uso de cualquier suplemento de vitaminaB6.  Tome lquidos que sean fros, transparentes y carbonatados o agrios. Los ejemplos [place], gaseosa de [place], gaseosa de [place], agua helada y soda.  Pruebe beber t con limn o menta.  Cepllese los dientes o use un enjuague bucal despus de las comidas. QU DEBO EVITAR PARA REDUCIR MIS SNTOMAS? Evitar algunas de las siguientes opciones puede ser de [place] para reducir sus sntomas.  Alimentos con [employer]. Intente comer en reas bien ventiladas libres de olores.  Tomar agua u otras bebidas con las comidas. Intente no beber nada 26minutos antes y despus de las comidas.  Beber ms de una taza de lquidos a la vez. A veces, el uso de un sorbete puede ser de [place].  Comidas fritas o con gran contenido de grasas, como salsas con [place] y crema.  Comidas muy condimentadas.  Saltear comidas. Las nuseas pueden ser ms intensas con el estmago vaco. Si no puede [employer] Life Insurance momento, no se obligue. Intente chupar trozos de hielo u otros elementos congelados e ingiera las caloras perdidas ms tarde.  Recostarse MGM [employer] horas siguientes a comer.  Desencadenantes ambientales. Estos pueden incluir habitaciones con humo, espacios cerrados, habitaciones con olores fuertes, lugares clidos o hmedos, habitaciones muy ruidosas y habitaciones con luces mviles o parpadeantes.  Cambios rpidos y repentinos en su movimiento. Esta informacin no tiene Marine scientist el consejo del mdico. Asegrese de hacerle  al mdico cualquier pregunta que tenga. Document Released: 12/24/2008 Document Revised: 09/12/2013 Document Reviewed:  04/07/2016 Elsevier Interactive Patient Education  2018 [place] parto y [place] de parto prematuros (Preterm Labor and Birth Information) La duracin de un embarazo normal es de 29 a 41semanas. Se llama trabajo de parto prematuro cuando se inicia antes de las 37semanas de [place]. [place]? Existen mayores probabilidades de trabajo de parto prematuro en mujeres con las siguientes caractersticas:  Tienen ciertas infecciones durante el embarazo, como infeccin de vejiga, infeccin de transmisin sexual o infeccin en el tero (corioamnionitis).  Tienen el cuello del tero ms corto que lo normal.  Tuvieron trabajo de parto prematuro anteriormente.  Se sometieron a una ciruga en el cuello del tero.  Son menores de 17aos o mayores de 53aos de edad.  Son afroamericanas.  Estn embarazadas de SPX Corporation o de varios bebs (gestacin mltiple).  Consumen drogas o fuman mientras estn embarazadas.  No aumentan de peso lo suficiente durante el [employer] Corporation.  Se embarazan poco despus de SUPERVALU INC. CULES SON LOS SNTOMAS DEL [place]? Los sntomas del trabajo de parto prematuro incluyen lo siguiente:  Marketing executive similares a los que ocurren durante el perodo menstrual. Los calambres pueden presentarse con diarrea.  Dolor en el abdomen o en la parte inferior de la espalda.  Contracciones uterinas regulares que se pueden sentir como una presin en el abdomen.  Una sensacin de mayor presin en la pelvis.  Aumento de la secrecin de moco acuoso o sanguinolento en la vagina.  Rotura de bolsa (rotura de saco amnitico). POR QU ES IMPORTANTE RECONOCER LOS SIGNOS DEL [place]? Es Glass blower/designer los signos del trabajo de parto prematuro porque los bebs que nacen de forma prematura pueden no estar completamente desarrollados. Por lo tanto, pueden  correr mayor riesgo de lo siguiente:  Problemas cardacos y pulmonares a Barrister's clerk (crnicos).  Inmediatamente despus del parto, dificultades para regular los sistemas corporales, que incluyen glucemia, temperatura corporal, frecuencia cardaca y frecuencia respiratoria.  Hemorragia cerebral.  Parlisis cerebral.  Dificultades en el aprendizaje.  Muerte. Estos riesgos son [employer] para bebs que nacen antes de las 34semanas de [place]. [place]? El tratamiento depende del tiempo de su [place], su afeccin y la salud de su beb. Puede incluir lo siguiente:  Tener un punto (sutura) en el cuello del tero para evitar que este se abra demasiado pronto (cerclaje).  Tomar medicamentos, por ejemplo: ? Medicamentos hormonales. Estos se pueden administrar de forma temprana en el embarazo para ayudar a Comptroller. ? [place] contracciones. ? Medicamentos que ayudan a [employer] del beb. Estos se pueden recetar si el riesgo de parto es [place]. ? Medicamentos para evitar que el beb desarrolle parlisis cerebral. Si el trabajo de parto de inicia antes de las 34semanas de [place], es posible que deba hospitalizarse. QU DEBO HACER SI CREO QUE ESTOY EN TRABAJO DE [place]? Si cree que est iniciando trabajo de parto prematuro, llame al mdico de inmediato. [place] DE PARTO PREMATURO EN FUTUROS EMBARAZOS? Para aumentar las probabilidades de tener un embarazo a trmino, Dance movement psychotherapist en cuenta lo siguiente:  No consuma ningn producto que contenga tabaco, lo que incluye cigarrillos, tabaco de Higher education careers adviser y Psychologist, sport and exercise. Si necesita ayuda para dejar de fumar, consulte al mdico.  No consuma drogas ni medicamentos que  no sean recetados durante el embarazo.  Hable con el mdico antes de tomar suplementos a base de hierbas aunque los Cal[employer] Corporation.  Asegrese de  llegar a un peso Tax adviser.  Tenga cuidado con las infecciones. Si cree que puede tener una infeccin, consulte al mdico para que la revisen.  Asegrese de informarle al mdico si ha tenido trabajo de parto prematuro antes. Esta informacin no tiene Marine scientist el consejo del mdico. Asegrese de hacerle al mdico cualquier pregunta que tenga. Document Released: 12/15/2007 Document Revised: 05/10/2013 Document Reviewed: 01/29/2016 Elsevier Interactive Patient Education  Henry Schein.

## 2018-04-02 NOTE — MAU Provider Note (Signed)
History     CSN: [id number]  Arrival date and time: 04/02/18 1035   First Provider Initiated Contact with Patient 04/02/18 1120      Chief Complaint  Patient presents with  . Contractions   HPI  Gabriela Morgan is a 37 y.o. [id number] at [redacted]w[redacted]d who presents with contractions & n/v. Reports contractions every 2 minutes since this morning. Took ibuprofen as she was previously prescribed for symptoms but vomited. Is not consistently taking her nausea meds. Previously admitted for HEG & abdominal pain r/t uterine fibroid.  Rates pain 10/10. Denies LOF or vaginal bleeding.   Spanish interpreter at bedside  OB History    Gravida  4   Para  3   Term  1   Preterm  2   AB  0   Living  3     SAB  0   TAB  0   Ectopic  0   Multiple  0   Live Births  3           Past Medical History:  Diagnosis Date  . Preterm labor   . Uterine fibroid     Past Surgical History:  Procedure Laterality Date  . APPENDECTOMY  2015   in [place]    Family History  Problem Relation Age of Onset  . Diabetes Mother   . Diabetes Brother     Social History   Tobacco Use  . Smoking status: Never Smoker  . Smokeless tobacco: Never Used  Substance Use Topics  . Alcohol use: Not Currently  . Drug use: Not Currently    Allergies: No Known Allergies  Medications Prior to Admission  Medication Sig Dispense Refill Last Dose  . glycopyrrolate (ROBINUL) 1 MG tablet Take 1 tablet (1 mg total) by mouth 3 (three) times daily. (Patient not taking: Reported on 03/29/2018) 90 tablet 0 Not Taking  . ibuprofen (ADVIL,MOTRIN) 600 MG tablet Take 1 tablet (600 mg total) by mouth every 6 (six) hours as needed for mild pain or moderate pain. 16 tablet 0   . metoCLOPramide (REGLAN) 10 MG tablet Take 1 tablet (10 mg total) by mouth every 6 (six) hours as needed for nausea (nausea/headache). (Patient not taking: Reported on 03/29/2018) 6 tablet 0 Not Taking  . ondansetron (ZOFRAN ODT) 4 MG  disintegrating tablet Take 1 tablet (4 mg total) by mouth every 8 (eight) hours as needed for nausea or vomiting. When phenergan or reglan don't work 20 tablet 0 Taking  . Prenatal Vit-Fe Fumarate-FA (PRENATAL MULTIVITAMIN) TABS tablet Take 1 tablet by mouth daily at 12 noon.   Not Taking  . promethazine (PHENERGAN) 25 MG suppository Place 1 suppository (25 mg total) rectally every 6 (six) hours as needed for nausea or vomiting. USE VAGINALLY AT BEDTIME (Patient not taking: Reported on 03/29/2018) 12 each 3 Not Taking  . ranitidine (ZANTAC) 150 MG tablet Take 1 tablet (150 mg total) by mouth 2 (two) times daily. 60 tablet 0 Taking    Review of Systems  Constitutional: Negative.   Gastrointestinal: Positive for abdominal pain, nausea and vomiting. Negative for diarrhea.  Genitourinary: Negative.    Physical Exam   Blood pressure 112/66, pulse 93, temperature 97.9 F (36.6 C), temperature source Oral, resp. rate 20, weight 120 lb (54.4 kg), last menstrual period 11/19/2017, SpO2 100 %.  Physical Exam  Nursing note and vitals reviewed. Constitutional: She is oriented to person, place, and time. She appears well-developed and well-nourished. No distress.  HENT:  Head: Normocephalic  and atraumatic.  Eyes: Conjunctivae are normal. Right eye exhibits no discharge. Left eye exhibits no discharge. No scleral icterus.  Neck: Normal range of motion.  Respiratory: Effort normal. No respiratory distress.  GI: Soft. There is no tenderness.  Genitourinary:  Genitourinary Comments: Dilation: 1 Effacement (%): 40 Cervical Position: Middle Station: Ballotable Presentation: Undeterminable Exam by:: Jorje Guild NP   Neurological: She is alert and oriented to person, place, and time.  Skin: Skin is warm and dry. She is not diaphoretic.  Psychiatric: She has a normal mood and affect. Her behavior is normal. Judgment and thought content normal.    MAU Course  Procedures Results for orders placed or  performed during the hospital encounter of 04/02/18 (from the past 24 hour(s))  Urinalysis, Routine w reflex microscopic     Status: Abnormal   Collection Time: 04/02/18 10:59 AM  Result Value Ref Range   Color, Urine YELLOW YELLOW   APPearance CLEAR CLEAR   Specific Gravity, Urine 1.021 1.005 - 1.030   pH 5.0 5.0 - 8.0   Glucose, UA NEGATIVE NEGATIVE mg/dL   Hgb urine dipstick MODERATE (A) NEGATIVE   Bilirubin Urine NEGATIVE NEGATIVE   Ketones, ur 80 (A) NEGATIVE mg/dL   Protein, ur 30 (A) NEGATIVE mg/dL   Nitrite NEGATIVE NEGATIVE   Leukocytes, UA NEGATIVE NEGATIVE   RBC / HPF 0-5 0 - 5 RBC/hpf   WBC, UA 0-5 0 - 5 WBC/hpf   Bacteria, UA RARE (A) NONE SEEN   Squamous Epithelial / LPF 0-5 0 - 5   Mucus PRESENT   Wet prep, genital     Status: None   Collection Time: 04/02/18  2:13 PM  Result Value Ref Range   Yeast Wet Prep HPF POC NONE SEEN NONE SEEN   Trich, Wet Prep NONE SEEN NONE SEEN   Clue Cells Wet Prep HPF POC NONE SEEN NONE SEEN   WBC, Wet Prep HPF POC NONE SEEN NONE SEEN   Sperm NONE SEEN     MDM FHT 154 by doppler Cervix 1 cm Cervical length 4.3 cm  Pt given IV fluids, zofran, phenergan, & dilaudid. Reports improvement in symptoms.   Reviewed pt with Dr. Harolyn Rutherford. Will start on vaginal progesterone until 17-p can be started.   Assessment and Plan  A: 1. Vomiting during pregnancy   2. [redacted] weeks gestation of pregnancy   3. Preterm uterine contractions in second trimester, antepartum   4. Dehydration    P: Discharge home Continue at home meds as previously prescribed Rx PV progesterone nightly Msg to [facility] for f/u & 17-P Discussed reasons to return to [facility] 04/02/2018, 11:20 AM

## 2018-04-14 ENCOUNTER — Encounter: Payer: Self-pay | Admitting: Obstetrics & Gynecology

## 2018-04-14 ENCOUNTER — Ambulatory Visit (INDEPENDENT_AMBULATORY_CARE_PROVIDER_SITE_OTHER): Payer: Self-pay | Admitting: Obstetrics & Gynecology

## 2018-04-14 VITALS — BP 101/59 | HR 90 | Wt 124.0 lb

## 2018-04-14 DIAGNOSIS — Z789 Other specified health status: Secondary | ICD-10-CM

## 2018-04-14 DIAGNOSIS — D259 Leiomyoma of uterus, unspecified: Secondary | ICD-10-CM

## 2018-04-14 DIAGNOSIS — Z3480 Encounter for supervision of other normal pregnancy, unspecified trimester: Secondary | ICD-10-CM

## 2018-04-14 DIAGNOSIS — O09522 Supervision of elderly multigravida, second trimester: Secondary | ICD-10-CM

## 2018-04-14 DIAGNOSIS — O09892 Supervision of other high risk pregnancies, second trimester: Secondary | ICD-10-CM

## 2018-04-14 DIAGNOSIS — O09212 Supervision of pregnancy with history of pre-term labor, second trimester: Secondary | ICD-10-CM

## 2018-04-14 DIAGNOSIS — O3412 Maternal care for benign tumor of corpus uteri, second trimester: Secondary | ICD-10-CM

## 2018-04-14 NOTE — Progress Notes (Signed)
No note

## 2018-04-14 NOTE — Progress Notes (Signed)
   PRENATAL VISIT NOTE  Subjective:  Gabriela Morgan is a 37 y.o. [phone] at [redacted]w[redacted]d being seen today for ongoing prenatal care.  She is currently monitored for the following issues for this high-risk pregnancy and has Hyperemesis affecting pregnancy, antepartum; Supervision of other normal pregnancy, antepartum; Limited prenatal care in second trimester; Uterine fibroids affecting pregnancy in second trimester; Language barrier; Hx of preterm delivery, currently pregnant, second trimester; and AMA (advanced maternal age) multigravida 67+, second trimester on their problem list.  Patient reports occasional contractions.  Contractions: Not present. Vag. Bleeding: None.  Movement: Present. Denies leaking of fluid.   The following portions of the patient's history were reviewed and updated as appropriate: allergies, current medications, past family history, past medical history, past social history, past surgical history and problem list. Problem list updated.  Objective:   Vitals:   04/14/18 1455  BP: (!) 101/59  Pulse: 90  Weight: 124 lb (56.2 kg)    Fetal Status: Fetal Heart Rate (bpm): 148   Movement: Present     General:  Alert, oriented and cooperative. Patient is in no acute distress.  Skin: Skin is warm and dry. No rash noted.   Cardiovascular: Normal heart rate noted  Respiratory: Normal respiratory effort, no problems with respiration noted  Abdomen: Soft, gravid, appropriate for gestational age.  Pain/Pressure: Present     Pelvic: Cervical exam deferred        Extremities: Normal range of motion.  Edema: None  Mental Status: Normal mood and affect. Normal behavior. Normal judgment and thought content.   Assessment and Plan:  Pregnancy: [id number] at [redacted]w[redacted]d  1. Supervision of other normal pregnancy, antepartum  2. Language barrier Spanish interpreter used for entire visit.   3. Hx of preterm delivery, currently pregnant, second trimester no s/sx of PTL 17-OH-P prev discussed  but, pt missed window    4. AMA (advanced maternal age) multigravida 68+, second trimester  5. Uterine fibroids affecting pregnancy in second trimester  Preterm labor symptoms and general obstetric precautions including but not limited to vaginal bleeding, contractions, leaking of fluid and fetal movement were reviewed in detail with the patient. Please refer to After Visit Summary for other counseling recommendations.  Return in about 2 weeks (around 04/28/2018).  No future appointments.  Lavonia Drafts, MD

## 2018-04-14 NOTE — Patient Instructions (Signed)
Prevencin del parto prematuro Preventing Preterm Birth Se conoce como parto prematuro al nacimiento del beb entre las semanas 20y37del embarazo. Un embarazo a trmino comprende un mnimo de 37semanas. El parto prematuro puede ser peligroso para el beb, ya que las ltimas semanas de Media planner representan un tiempo importante en el que el cerebro y los pulmones del beb se desarrollan. Muchas cosas pueden hacer que el beb nazca antes de tiempo. Algunas veces la causa no se conoce. Hay ciertos factores que la hacen ms propensa a Engineer, civil (consulting), por ejemplo:  Risk manager tenido un parto prematuro antes.  Estar embarazada de ms de un beb.  Haberse sometido a un tratamiento de fertilidad.  Tener sobrepeso o muy bajo peso al comienzo del Media planner.  Haber experimentado algo de lo siguiente durante el embarazo: ? Una infeccin, incluida una infeccin de las vas urinarias o una infeccin de transmisin sexual (ITS). ? Hipertensin arterial. ? Diabetes. ? Hemorragia vaginal.  Tener 35aos o ms.  Tener 18aos o menos.  Quedar embarazada en el transcurso de los 6 meses posteriores a Psychologist, clinical.  Sufrir Teaching laboratory technician, o Orthoptist fsico o Patent attorney.  Estar parada durante perodos muy prolongados durante el embarazo, como trabajar en un puesto que lo requiera.  Cules son los riesgos? El riesgo ms grave del parto prematuro es que el beb no sobreviva. Es ms probable que esto ocurra si un beb nace antes de las 34semanas. Otros riesgos y complicaciones del parto prematuro podran incluir que el beb tuviera algo de lo siguiente:  Problemas respiratorios.  Dao cerebral que afecta el movimiento y la coordinacin(parlisis cerebral).  Dificultades para alimentarse.  Problemas de visin o audicin.  Infecciones o inflamacin del tubo digestivo(colitis).  Retrasos en el desarrollo.  Dificultades de aprendizaje.  Mayor riesgo de padecer diabetes,  enfermedades cardacas y presin arterial alta en el futuro.  Qu puedo hacer para disminuir el riesgo? Atencin mdica  Lo ms importante que puede hacer para disminuir el riesgo de Best boy un parto prematuro es Therapist, music atencin mdica de rutina durante el Media planner (cuidado prenatal). Si el riesgo de Best boy un parto prematuro es alto, la podran derivar a un mdico que se especialice en el control de Building control surveyor de Sales executive (perinatlogo). Tal vez le receten medicamentos para ayudar a Training and development officer. Cambios en el estilo de vida Ciertos cambios en el estilo de vida tambin pueden disminuir el riesgo de tener un parto prematuro:  Espere al menos 6 meses despus de un embarazo para volver a Product/process development scientist.  En la medida de lo posible, planifique quedar [employer].  Antes de Maurilio Lovely, logre un peso saludable. Si tiene sobrepeso, trabaje con el mdico para adelgazar sin riegos.  No consuma ningn producto que contenga nicotina o tabaco, como cigarrillos y Psychologist, sport and exercise. Si necesita ayuda para dejar de fumar, consulte al mdico.  No beba alcohol.  No consuma drogas.  Dnde encontrar apoyo: Para obtener ms ayuda, considere lo siguiente:  Hablar con el mdico.  Hablar con un terapeuta o un asesor de consumo de drogas si necesita ayuda para dejar de hacerlo.  Trabajar con un especialista en alimentacin y nutricin (nutricionista) o un entrenador fsico para Theatre manager un peso saludable.  Unirse a un grupo de apoyo.  Dnde encontrar ms informacin: Obtenga ms informacin sobre cmo prevenir un Production designer, theatre/television/film en los siguientes sitios:  Centros para el control y la prevencin de enfermedades(Centers for Disease Control and Prevention, CDC): https://harris-spencer.com/.htm  March of Dimes: marchofdimes.org/complications/premature-babies.aspx  Asociacin Cherlyn Cushing del Glennis Brink (American  Pregnancy Association): americanpregnancy.org/labor-and-birth/premature-labor  Comunquese con un mdico si:  Tiene cualquiera de estos signos de trabajo de parto prematuro antes de las 37semanas: ? Cambio o aumento de la secrecin vaginal. ? Prdida de lquido por la vagina. ? Presin o calambres en la parte inferior del abdomen. ? Dolor de espalda que no se calma o empeora. ? Endurecimiento regular (contracciones) en la parte inferior del abdomen. Resumen  Parto prematuro significa tener al beb durante las semanas [place].  El parto prematuro podra poner en riesgo de padecer problemas fsicos y mentales al beb.  Obtener un buen cuidado prenatal puede ayudar a Training and development officer.  Puede disminuir el riesgo de tener un Media planner prematuro realizando ciertos cambios en el estilo de vida. Esta informacin no tiene Marine scientist el consejo del mdico. Asegrese de hacerle al mdico cualquier pregunta que tenga. Document Released: 10/22/2015 Document Revised: 01/15/2017 Document Reviewed: 10/22/2015 Elsevier Interactive Patient Education  [employer].

## 2018-04-17 ENCOUNTER — Encounter (HOSPITAL_COMMUNITY): Payer: Self-pay | Admitting: *Deleted

## 2018-04-17 ENCOUNTER — Inpatient Hospital Stay (HOSPITAL_COMMUNITY)
Admission: AD | Admit: 2018-04-17 | Discharge: 2018-04-17 | Disposition: A | Discharge status: Home / Self Care | Payer: Self-pay | Source: Ambulatory Visit | Attending: Obstetrics & Gynecology | Admitting: Obstetrics & Gynecology

## 2018-04-17 DIAGNOSIS — Z3A24 24 weeks gestation of pregnancy: Secondary | ICD-10-CM | POA: Insufficient documentation

## 2018-04-17 DIAGNOSIS — O26893 Other specified pregnancy related conditions, third trimester: Secondary | ICD-10-CM | POA: Diagnosis present

## 2018-04-17 DIAGNOSIS — O26892 Other specified pregnancy related conditions, second trimester: Secondary | ICD-10-CM | POA: Insufficient documentation

## 2018-04-17 DIAGNOSIS — E860 Dehydration: Secondary | ICD-10-CM | POA: Diagnosis present

## 2018-04-17 DIAGNOSIS — R109 Unspecified abdominal pain: Secondary | ICD-10-CM | POA: Insufficient documentation

## 2018-04-17 LAB — URINALYSIS, ROUTINE W REFLEX MICROSCOPIC
BACTERIA UA: NONE SEEN
BILIRUBIN URINE: NEGATIVE
Glucose, UA: NEGATIVE mg/dL
HGB URINE DIPSTICK: NEGATIVE
Ketones, ur: 80 mg/dL — AB
LEUKOCYTES UA: NEGATIVE
NITRITE: NEGATIVE
PROTEIN: 30 mg/dL — AB
Specific Gravity, Urine: 1.017 (ref 1.005–1.030)
pH: 9 — ABNORMAL HIGH (ref 5.0–8.0)

## 2018-04-17 MED ORDER — PROMETHAZINE HCL 25 MG/ML IJ SOLN
12.5000 mg | Freq: Once | INTRAMUSCULAR | Status: AC
  Administered 2018-04-17: 12.5 mg via INTRAMUSCULAR
  Filled 2018-04-17: qty 1

## 2018-04-17 MED ORDER — PROMETHAZINE HCL 25 MG RE SUPP: 25.0000 mg | Freq: Four times a day (QID) | RECTAL | 3 refills | Status: DC | PRN

## 2018-04-17 MED ORDER — PROGESTERONE MICRONIZED 200 MG PO CAPS: ORAL_CAPSULE | ORAL | 3 refills | Status: DC

## 2018-04-17 MED ORDER — ONDANSETRON 4 MG PO TBDP: 4.0000 mg | ORAL_TABLET | Freq: Three times a day (TID) | ORAL | 0 refills | Status: DC | PRN

## 2018-04-17 MED ORDER — GLYCOPYRROLATE 1 MG PO TABS: 1.0000 mg | ORAL_TABLET | Freq: Three times a day (TID) | ORAL | 0 refills | Status: DC

## 2018-04-17 MED ORDER — THIAMINE HCL 100 MG/ML IJ SOLN
Freq: Once | INTRAVENOUS | Status: AC
  Administered 2018-04-17: 20:00:00 via INTRAVENOUS
  Filled 2018-04-17: qty 1000

## 2018-04-17 MED ORDER — METOCLOPRAMIDE HCL 10 MG PO TABS: 10.0000 mg | ORAL_TABLET | Freq: Four times a day (QID) | ORAL | 0 refills | Status: DC | PRN

## 2018-04-17 MED ORDER — NALBUPHINE HCL 10 MG/ML IJ SOLN
10.0000 mg | Freq: Once | INTRAMUSCULAR | Status: AC
  Administered 2018-04-17: 10 mg via INTRAMUSCULAR
  Filled 2018-04-17: qty 1

## 2018-04-17 MED ORDER — LACTATED RINGERS IV BOLUS
1000.0000 mL | Freq: Once | INTRAVENOUS | Status: AC
  Administered 2018-04-17: 1000 mL via INTRAVENOUS

## 2018-04-17 MED ORDER — ONDANSETRON HCL 40 MG/20ML IJ SOLN
8.0000 mg | Freq: Once | INTRAMUSCULAR | Status: AC
  Administered 2018-04-17: 8 mg via INTRAVENOUS
  Filled 2018-04-17: qty 4

## 2018-04-17 NOTE — MAU Note (Signed)
Gabriela Morgan is a 37 y.o. at [redacted]w[redacted]d here in MAU reporting:  +abdominal pain Reports it feels like contractions; intermittent cramping Onset of complaint: started this am Pain score: 10/10 Vitals:   04/17/18 1616  BP: 125/82  Pulse: 99  Resp: 20  Temp: 98 F (36.7 C)  SpO2: 100%     FHT:160 Lab orders placed from triage: ua

## 2018-04-17 NOTE — Discharge Instructions (Signed)
Fibromas uterinos (Uterine Fibroids) Los fibromas uterinos son Cherrie Gauze (tumores) de tejido. Tambin se los [employer]. Pueden desarrollarse dentro del abdomen de Musician (tero) y crecer hasta volverse muy grandes. Los fibromas no son cancerosos (benignos). La mayora no requiere tratamiento mdico. CUIDADOS EN EL HOGAR  Concurra a todas las visitas de control como se lo haya indicado el mdico. Esto es importante.  Tome los medicamentos solamente como se lo haya indicado el mdico. ? Si le recetaron un tratamiento hormonal, tome los medicamentos hormonales exactamente como se lo indicaron. ? No tome aspirina. Puede ocasionar hemorragias.  Consulte al [employer] sobre tomar comprimidos de hierro y Garment/textile technologist la cantidad de verduras de hoja color verde oscuro en la dieta. Estas medidas pueden ayudar a incrementar los niveles de Tax adviser.  Preste mucha atencin a Hydrographic surveyor. Informe al mdico si hay algn cambio, por ejemplo: ? Aumento del flujo de [place]. Esto puede exigirle el uso de ms compresas o tampones que los que [place] normalmente cada mes. ? Un cambio en la cantidad de [employer] dura la menstruacin cada mes. ? Un cambio en los sntomas que se manifiestan con la menstruacin, como dolor de espalda o clicos en la zona del vientre (abdomen). SOLICITE AYUDA SI:  Siente dolor de espalda o en la zona que se encuentra entre los huesos de la cadera (zona plvica) que los medicamentos no Transport planner.  Siente dolor en el abdomen que los medicamentos no Transport planner.  Observa un aumento del sangrado entre y [employer].  Empapa los tampones o las compresas en el trmino de media hora o Stem.  Se siente mareada.  Se siente muy cansado.  Se siente dbil. SOLICITE AYUDA DE INMEDIATO SI:  Pierde el conocimiento (se desmaya).  El dolor plvico aumenta repentinamente. Esta informacin no tiene Marine scientist el consejo del  mdico. Asegrese de hacerle al mdico cualquier pregunta que tenga. Document Released: 12/23/2010 Document Revised: 09/28/2014 Document Reviewed: 03/06/2014 Elsevier Interactive Patient Education  [employer]in.

## 2018-04-17 NOTE — MAU Provider Note (Addendum)
History   [id number] in with abd pain that started this morning at 0900 denies ROM or vag bleeding. Pt has hx of uterine fibroid.  CSN: [id number]  Arrival date & time 04/17/18  1603   None     Chief Complaint  Patient presents with  . Abdominal Pain    HPI  Past Medical History:  Diagnosis Date  . Preterm labor   . Uterine fibroid     Past Surgical History:  Procedure Laterality Date  . APPENDECTOMY  2015   in [place]    Family History  Problem Relation Age of Onset  . Diabetes Mother   . Diabetes Brother     Social History   Tobacco Use  . Smoking status: Never Smoker  . Smokeless tobacco: Never Used  Substance Use Topics  . Alcohol use: Not Currently  . Drug use: Not Currently    OB History    Gravida  5   Para  3   Term  1   Preterm  2   AB  0   Living  3     SAB  0   TAB  0   Ectopic  0   Multiple  0   Live Births  3           Review of Systems  Constitutional: Negative.   HENT: Negative.   Eyes: Negative.   Respiratory: Negative.   Cardiovascular: Negative.   Gastrointestinal: Positive for abdominal pain.  Endocrine: Negative.   Genitourinary: Negative.   Musculoskeletal: Negative.   Skin: Negative.   Allergic/Immunologic: Negative.   Neurological: Negative.   Hematological: Negative.   Psychiatric/Behavioral: Negative.     Allergies  Patient has no known allergies.  Home Medications    BP 125/82 (BP Location: Left Arm)   Pulse 99   Temp 98 F (36.7 C) (Oral)   Resp 20   LMP 11/19/2017 Comment: pt has not been seen yet for this pregnancy   SpO2 100% Comment: ra  Physical Exam  Constitutional: She is oriented to person, place, and time. She appears well-developed and well-nourished.  HENT:  Head: Normocephalic.  Cardiovascular: Normal rate, regular rhythm, normal heart sounds and intact distal pulses.  Pulmonary/Chest: Effort normal and breath sounds normal.  Abdominal: Soft. Normal appearance and bowel  sounds are normal.  Genitourinary: Rectum normal, vagina normal and uterus normal.  Neurological: She is alert and oriented to person, place, and time.  Skin: Skin is warm and dry.  Psychiatric: She has a normal mood and affect. Her behavior is normal.    MAU Course  Procedures (including critical care time)  Labs Reviewed  URINALYSIS, ROUTINE W REFLEX MICROSCOPIC   No results found. Results for orders placed or performed during the hospital encounter of 04/17/18 (from the past 24 hour(s))  Urinalysis, Routine w reflex microscopic     Status: Abnormal   Collection Time: 04/17/18  5:03 PM  Result Value Ref Range   Color, Urine YELLOW YELLOW   APPearance CLOUDY (A) CLEAR   Specific Gravity, Urine 1.017 1.005 - 1.030   pH 9.0 (H) 5.0 - 8.0   Glucose, UA NEGATIVE NEGATIVE mg/dL   Hgb urine dipstick NEGATIVE NEGATIVE   Bilirubin Urine NEGATIVE NEGATIVE   Ketones, ur 80 (A) NEGATIVE mg/dL   Protein, ur 30 (A) NEGATIVE mg/dL   Nitrite NEGATIVE NEGATIVE   Leukocytes, UA NEGATIVE NEGATIVE   RBC / HPF 0-5 0 - 5 RBC/hpf   Bacteria, UA NONE SEEN NONE  SEEN   Squamous Epithelial / LPF 0-5 0 - 5   Mucus PRESENT    Amorphous Crystal PRESENT     No diagnosis found. care signed over to Jorje Guild FNP   MDM  VSS, FHR 150's no uc's. SVE outer os ft, inner os closed/th/post/high. Will attempt to manage pain. U/A 80 ketones. Will iv hydrate.  Assumed care of patient at 2000.  2100: Patient now feeling much better; pain is now a 5/10 and before it was a 10/10.    1. Abdominal pain in pregnancy, second trimester   2. Dehydration    2. Reviewed patient's medications; she has not been taking any medication because she does not have anyone to pick it up for her. Switched her pharmacy to the [place].   Plan for discharge with RX for her medications. I reviewed in detail how to take each one and where to go for pick-up.    Maye Hides CNM

## 2018-04-19 ENCOUNTER — Inpatient Hospital Stay (HOSPITAL_COMMUNITY)
Admission: AD | Admit: 2018-04-19 | Discharge: 2018-04-19 | Disposition: A | Discharge status: Home / Self Care | Payer: Self-pay | Source: Ambulatory Visit | Attending: Family Medicine | Admitting: Family Medicine

## 2018-04-19 ENCOUNTER — Encounter (HOSPITAL_COMMUNITY): Payer: Self-pay

## 2018-04-19 DIAGNOSIS — R109 Unspecified abdominal pain: Secondary | ICD-10-CM | POA: Insufficient documentation

## 2018-04-19 DIAGNOSIS — O212 Late vomiting of pregnancy: Secondary | ICD-10-CM | POA: Insufficient documentation

## 2018-04-19 DIAGNOSIS — O9989 Other specified diseases and conditions complicating pregnancy, childbirth and the puerperium: Secondary | ICD-10-CM

## 2018-04-19 DIAGNOSIS — O99891 Other specified diseases and conditions complicating pregnancy: Secondary | ICD-10-CM

## 2018-04-19 DIAGNOSIS — M549 Dorsalgia, unspecified: Secondary | ICD-10-CM | POA: Insufficient documentation

## 2018-04-19 DIAGNOSIS — Z3A24 24 weeks gestation of pregnancy: Secondary | ICD-10-CM | POA: Insufficient documentation

## 2018-04-19 DIAGNOSIS — O26892 Other specified pregnancy related conditions, second trimester: Secondary | ICD-10-CM | POA: Insufficient documentation

## 2018-04-19 DIAGNOSIS — E860 Dehydration: Secondary | ICD-10-CM | POA: Insufficient documentation

## 2018-04-19 LAB — URINALYSIS, ROUTINE W REFLEX MICROSCOPIC
Bilirubin Urine: NEGATIVE
Glucose, UA: NEGATIVE mg/dL
Hgb urine dipstick: NEGATIVE
Ketones, ur: 80 mg/dL — AB
Leukocytes, UA: NEGATIVE
NITRITE: NEGATIVE
Protein, ur: NEGATIVE mg/dL
Specific Gravity, Urine: 1.016 (ref 1.005–1.030)
pH: 9 — ABNORMAL HIGH (ref 5.0–8.0)

## 2018-04-19 LAB — CBC
HCT: 31.5 % — ABNORMAL LOW (ref 36.0–46.0)
HEMOGLOBIN: 10.7 g/dL — AB (ref 12.0–15.0)
MCH: 29.5 pg (ref 26.0–34.0)
MCHC: 34 g/dL (ref 30.0–36.0)
MCV: 86.8 fL (ref 78.0–100.0)
Platelets: 288 10*3/uL (ref 150–400)
RBC: 3.63 MIL/uL — ABNORMAL LOW (ref 3.87–5.11)
RDW: 13.5 % (ref 11.5–15.5)
WBC: 12.2 10*3/uL — AB (ref 4.0–10.5)

## 2018-04-19 LAB — BASIC METABOLIC PANEL
Anion gap: 11 (ref 5–15)
BUN: <5 mg/dL — ABNORMAL LOW (ref 6–20)
CO2: 22 mmol/L (ref 22–32)
Calcium: 8.2 mg/dL — ABNORMAL LOW (ref 8.9–10.3)
Chloride: 98 mmol/L (ref 98–111)
Creatinine, Ser: 0.35 mg/dL — ABNORMAL LOW (ref 0.44–1.00)
GFR calc non Af Amer: >60 mL/min (ref >60)
Glucose, Bld: 92 mg/dL (ref 70–99)
POTASSIUM: 3.2 mmol/L — AB (ref 3.5–5.1)
SODIUM: 131 mmol/L — AB (ref 135–145)

## 2018-04-19 MED ORDER — HYDROMORPHONE HCL 1 MG/ML IJ SOLN
1.0000 mg | Freq: Once | INTRAMUSCULAR | Status: AC
  Administered 2018-04-19: 1 mg via INTRAVENOUS
  Filled 2018-04-19: qty 1

## 2018-04-19 MED ORDER — ACETAMINOPHEN 325 MG PO TABS: 650.0000 mg | ORAL_TABLET | Freq: Once | ORAL | Status: DC

## 2018-04-19 MED ORDER — DEXTROSE 5 % IN LACTATED RINGERS IV BOLUS
1000.0000 mL | Freq: Once | INTRAVENOUS | Status: AC
  Administered 2018-04-19: 1000 mL via INTRAVENOUS

## 2018-04-19 MED ORDER — PROMETHAZINE HCL 25 MG PO TABS
25.0000 mg | ORAL_TABLET | Freq: Once | ORAL | Status: AC
  Administered 2018-04-19: 25 mg via ORAL
  Filled 2018-04-19: qty 1

## 2018-04-19 NOTE — MAU Provider Note (Signed)
History     CSN: [id number]  Arrival date and time: 04/19/18 1054   None     Chief Complaint  Patient presents with  . Back Pain   HPI   Gabriela Morgan is 37 y.o. [phone] at [redacted]w[redacted]d with h/o hyperemesis of pregnancy and fibroid uterus who presents to MAU with back pain. Back pain started this morning. Located over the right lumbar paraspinal muscles. Reports slipping in the shower yesterday but catching herself before she fell. Has taken Advil at home without relief of her pain. Also endorsing lower abdominal pain that started a few days ago, was seen in MAU for this two days prior. Has been vomiting all morning. Denies LOF, vaginal bleeding, and vaginal discharge. Reports good FM. Patient has not yet picked up medications prescribed for hyperemesis. States she plans to pick these up from pharmacy today.   OB History    Gravida  5   Para  3   Term  1   Preterm  2   AB  0   Living  3     SAB  0   TAB  0   Ectopic  0   Multiple  0   Live Births  3           Past Medical History:  Diagnosis Date  . Preterm labor   . Uterine fibroid     Past Surgical History:  Procedure Laterality Date  . APPENDECTOMY  2015   in [place]    Family History  Problem Relation Age of Onset  . Diabetes Mother   . Diabetes Brother     Social History   Tobacco Use  . Smoking status: Never Smoker  . Smokeless tobacco: Never Used  Substance Use Topics  . Alcohol use: Not Currently  . Drug use: Not Currently    Allergies: No Known Allergies  Medications Prior to Admission  Medication Sig Dispense Refill Last Dose  . glycopyrrolate (ROBINUL) 1 MG tablet Take 1 tablet (1 mg total) by mouth 3 (three) times daily. 90 tablet 0   . metoCLOPramide (REGLAN) 10 MG tablet Take 1 tablet (10 mg total) by mouth every 6 (six) hours as needed for nausea (nausea/headache). 6 tablet 0   . ondansetron (ZOFRAN ODT) 4 MG disintegrating tablet Take 1 tablet (4 mg total) by mouth every 8  (eight) hours as needed for nausea or vomiting. When phenergan or reglan don't work 20 tablet 0   . Prenatal Vit-Fe Fumarate-FA (PRENATAL MULTIVITAMIN) TABS tablet Take 1 tablet by mouth daily at 12 noon.   Not Taking  . progesterone (PROMETRIUM) 200 MG capsule Place one capsule vaginally at bedtime 30 capsule 3   . promethazine (PHENERGAN) 25 MG suppository Place 1 suppository (25 mg total) rectally every 6 (six) hours as needed for nausea or vomiting. USE RECTALLY AT BEDTIME 12 each 3   . ranitidine (ZANTAC) 150 MG tablet Take 1 tablet (150 mg total) by mouth 2 (two) times daily. 60 tablet 0 Taking    Review of Systems  Constitutional: Negative for chills and fever.  Respiratory: Negative for cough and shortness of breath.   Cardiovascular: Negative for chest pain.  Gastrointestinal: Positive for abdominal pain, nausea and vomiting. Negative for constipation and diarrhea.  Genitourinary: Negative for difficulty urinating, dysuria, frequency and urgency.  Musculoskeletal: Positive for back pain.   Physical Exam   Blood pressure 118/66, pulse (!) 104, temperature 98.3 F (36.8 C), temperature source Oral, resp. rate (!) 22, weight  125 lb (56.7 kg), last menstrual period 11/19/2017, unknown if currently breastfeeding.  Physical Exam  Constitutional: She is oriented to person, place, and time. She appears well-developed and well-nourished.  Appears uncomfortable 2/2 to pain.   HENT:  Head: Normocephalic and atraumatic.  Eyes: Conjunctivae and EOM are normal.  Neck: Normal range of motion. Neck supple.  Cardiovascular: Regular rhythm.  Mildly tachycardic.   Respiratory: Effort normal and breath sounds normal.  GI: Soft. She exhibits no distension. There is no tenderness. There is no rebound and no guarding.  Musculoskeletal: Normal range of motion.  TTP over the right lumbar paraspinal muscles. No CVA tenderness.   Neurological: She is alert and oriented to person, place, and time. She  exhibits normal muscle tone.  Skin: Skin is warm and dry.   FHT baseline 150 bpm. Toco without ctx.   MAU Course  Procedures  MDM UA obtained and consistent with significant dehydration with ketones of 80. Patient unable to tolerate PO challenge at presentation. Given anti-emetics and D5LR bolus. Dilaudid 1 mg given for pain control. CBC/BMET collected, remarkable for slightly diminished levels of K likely related to dehydration and emesis.   On repeat check, patient reports pain has subsided and she feels much better. Stable for discharge home.   Assessment and Plan   1. Abdominal pain in pregnancy, second trimester   2. Dehydration   3. Back pain in pregnancy    Patient has not been taking any of her medications. Suspect that most of patients presentation is related to hyperemesis in pregnancy. Vomiting and pain resolved after treatment. Back pain seems musculoskeletal in nature. Discussed importance of picking up medications today. Strict return precautions discussed.   Melina Schools 04/19/2018, 11:21 AM

## 2018-04-19 NOTE — Discharge Instructions (Signed)
Please pick up the medications from your pharmacy. Take frequent sips of water to stay hydrated    Dolor abdominal en los adultos Abdominal Pain, Adult El dolor de [place] (abdominal) puede tener muchas causas. [place] veces, el dolor de [place] no es peligroso. Muchos de [employer] de dolor de estmago pueden controlarse y tratarse en casa. Sin embargo, a Clinical cytogeneticist, Conservation officer, historic buildings de [employer] puede ser grave. El mdico intentar descubrir la causa del dolor de [place]. Siga estas indicaciones en su casa:  Tome los medicamentos de venta libre y los recetados solamente como se lo haya indicado el mdico. No tome medicamentos que lo ayuden a Landscape architect (laxantes), salvo que el mdico se lo indique.  Beba suficiente lquido para mantener el pis (orina) claro o de color amarillo plido.  Est atento al dolor de estmago para Actuary cambio.  Concurra a todas las visitas de control como se lo haya indicado el mdico. Esto es importante. Comunquese con un mdico si:  El dolor de estmago cambia o [place].  No tiene apetito o baja de peso sin proponrselo.  Tiene dificultades para defecar (est estreido) o heces lquidas (diarrea) durante ms de 2 o 3das.  Siente dolor al orinar o defecar.  El dolor de estmago lo despierta de noche.  El dolor empeora con las comidas, despus de comer o con determinados alimentos.  Vomita y no puede retener nada de lo que ingiere.  Tiene fiebre. Solicite ayuda de inmediato si:  El dolor no desaparece en el tiempo indicado por el mdico.  No puede detener los vmitos.  Siente dolor solamente en zonas especficas del abdomen, como el lado derecho o la parte inferior izquierda.  Tiene heces con sangre, de color negro o con aspecto alquitranado.  Tiene dolor muy intenso en el vientre, clicos o meteorismo.  Presenta signos de no tener suficientes lquidos o agua en el cuerpo (deshidratacin), por ejemplo: ? La [place] es [place], es  muy escasa o no orina. ? Labios agrietados. ? [employer]. ? Ojos hundidos. ? Somnolencia. ? Debilidad. Esta informacin no tiene Marine scientist el consejo del mdico. Asegrese de hacerle al mdico cualquier pregunta que tenga. Document Released: 12/04/2008 Document Revised: 12/02/2016 Document Reviewed: 02/19/2016 Elsevier Interactive Patient Education  [employer]. .

## 2018-04-19 NOTE — MAU Note (Addendum)
Pt mid back pain R side, slipped in shower yesterday, didn't fall.  Started having mild pain yesterday, pain was severe when she got up this morning. Also having lower abd pain.  Denies bleeding or LOF.

## 2018-04-24 ENCOUNTER — Encounter (HOSPITAL_COMMUNITY): Payer: Self-pay

## 2018-04-24 ENCOUNTER — Other Ambulatory Visit: Payer: Self-pay

## 2018-04-24 ENCOUNTER — Inpatient Hospital Stay (HOSPITAL_COMMUNITY)
Admission: AD | Admit: 2018-04-24 | Discharge: 2018-04-24 | Disposition: A | Discharge status: Home / Self Care | Payer: Self-pay | Source: Ambulatory Visit | Attending: Obstetrics & Gynecology | Admitting: Obstetrics & Gynecology

## 2018-04-24 DIAGNOSIS — E860 Dehydration: Secondary | ICD-10-CM

## 2018-04-24 DIAGNOSIS — Z833 Family history of diabetes mellitus: Secondary | ICD-10-CM | POA: Insufficient documentation

## 2018-04-24 DIAGNOSIS — R634 Abnormal weight loss: Secondary | ICD-10-CM | POA: Insufficient documentation

## 2018-04-24 DIAGNOSIS — Z9889 Other specified postprocedural states: Secondary | ICD-10-CM | POA: Insufficient documentation

## 2018-04-24 DIAGNOSIS — Z3A25 25 weeks gestation of pregnancy: Secondary | ICD-10-CM | POA: Insufficient documentation

## 2018-04-24 DIAGNOSIS — R109 Unspecified abdominal pain: Secondary | ICD-10-CM

## 2018-04-24 DIAGNOSIS — Z79899 Other long term (current) drug therapy: Secondary | ICD-10-CM | POA: Insufficient documentation

## 2018-04-24 DIAGNOSIS — O212 Late vomiting of pregnancy: Secondary | ICD-10-CM | POA: Insufficient documentation

## 2018-04-24 DIAGNOSIS — Z7983 Long term (current) use of bisphosphonates: Secondary | ICD-10-CM | POA: Insufficient documentation

## 2018-04-24 DIAGNOSIS — O26892 Other specified pregnancy related conditions, second trimester: Secondary | ICD-10-CM | POA: Insufficient documentation

## 2018-04-24 DIAGNOSIS — R1110 Vomiting, unspecified: Secondary | ICD-10-CM

## 2018-04-24 DIAGNOSIS — Z79890 Hormone replacement therapy (postmenopausal): Secondary | ICD-10-CM | POA: Insufficient documentation

## 2018-04-24 DIAGNOSIS — O99282 Endocrine, nutritional and metabolic diseases complicating pregnancy, second trimester: Secondary | ICD-10-CM | POA: Insufficient documentation

## 2018-04-24 DIAGNOSIS — O219 Vomiting of pregnancy, unspecified: Secondary | ICD-10-CM

## 2018-04-24 LAB — URINALYSIS, ROUTINE W REFLEX MICROSCOPIC
BILIRUBIN URINE: NEGATIVE
Glucose, UA: NEGATIVE mg/dL
KETONES UR: 80 mg/dL — AB
Nitrite: NEGATIVE
PROTEIN: 30 mg/dL — AB
SPECIFIC GRAVITY, URINE: 1.017 (ref 1.005–1.030)
pH: 6 (ref 5.0–8.0)

## 2018-04-24 LAB — FETAL FIBRONECTIN: FETAL FIBRONECTIN: NEGATIVE

## 2018-04-24 MED ORDER — KETOROLAC TROMETHAMINE 30 MG/ML IJ SOLN
30.0000 mg | Freq: Once | INTRAMUSCULAR | Status: AC
  Administered 2018-04-24: 30 mg via INTRAVENOUS
  Filled 2018-04-24: qty 1

## 2018-04-24 MED ORDER — PROMETHAZINE HCL 25 MG PO TABS: 25.0000 mg | ORAL_TABLET | Freq: Four times a day (QID) | ORAL | 1 refills | Status: DC | PRN

## 2018-04-24 MED ORDER — PROMETHAZINE HCL 25 MG/ML IJ SOLN
25.0000 mg | Freq: Once | INTRAVENOUS | Status: AC
  Administered 2018-04-24: 25 mg via INTRAVENOUS
  Filled 2018-04-24: qty 1

## 2018-04-24 NOTE — Progress Notes (Addendum)
G5P1 @ 25.[redacted] wksga. Spanish speaking only pt that presents to traige for N/V and abd pain. Pt took tylenol last night 2 pills.This started 4 days ago and she notified [facility] clinic and they told her she would have to wait.  July 30 was here for the same thing. She was tx with IV fluids only and sent home.   Denies LOF or bleeding. +FM. States feels like have to pee all the time.   Last intercourse was 2 months ago.   Hx: Fibroids   Temp 98.2 F (36.8 C) (Oral)   Resp 18   Ht 5' 3.39" (1.61 m)   Wt 118 lb 8 oz (53.8 kg)   LMP 11/19/2017 Comment: pt has not been seen yet for this pregnancy   SpO2 99%   BMI 20.74 kg/m   1127 Provider at bs assessing. ffn done. VE same as before; unchanged.    Ordered IV d5lr with phenergan and toradol.   1210 iv inserted. d5lr w/phenergan bolus  1216 toradol given  1337: PO challenge initiated.  Interpreter at bs asking about increased salivation. Informed pt med available if needed.  1410 Pt tolerated PO drink and crackers. Pain 3/10. Provider made aware. IV d/c'd with tip intact and noted. Instructed pt to get dressed.   1507: interpreter notified for room 3.  1510. Provider at bs reassessing with interpreter at bs. D/c instructions given with pt understanding.  1525: pt left unit via ambulatory.

## 2018-04-24 NOTE — Discharge Instructions (Signed)
Hiperemesis gravdica (Hyperemesis Gravidarum) La hiperemesis gravdica es una forma grave de nuseas y vmitos que ocurren durante el Media planner Es peor que las nuseas matutinas. Puede provocarle [place], [place]. Posiblemente provoque que no coma ni beba la cantidad suficiente de alimentos y lquidos. Generalmente ocurre durante la primera mitad (las primeras 20 semanas) de Media planner. En general mejora en la segunda mitad del embarazo. Pero en algunos casos continua durante todo el embarazo. CAUSAS La causa de esta afeccin no se conoce por completo, pero se cree que est relacionada con los [employer] hormonas del organismo durante el [place]. Se podra dar debido a un nivel alto de la hormona del Media planner o a un aumento de Designer, multimedia. SIGNOS Y SNTOMAS  Nuseas y vmitos intensos.  Nuseas no mejoran.  Vmitos que le impiden [employer].  Prdida de peso y de lquidos corporales (deshidratacin).  No tener deseos de comer ni tener agrado por los alimentos que antes disfrutaba.  DIAGNSTICO El mdico le preguntar acerca de sus sntomas y le har un examen fsico. Posiblemente tambin le indique anlisis de sangre y de [place] para asegurarse de que no haya otra causa del problema. TRATAMIENTO Es posible que nicamente necesite tomar algunos medicamentos para Archivist. Si los medicamentos no controlan las nuseas y los vmitos, recibir un Scientist, physiological hospital para prevenir la deshidratacin, un aumento de la acidez en la sangre (acidosis), la prdida de peso y las modificaciones electrolticas en el organismo que podran perjudicar al beb en gestacin (feto). Probablemente necesite recibir lquidos por va intravenosa (IV). [place] solo medicamentos de venta libre o recetados, segn las indicaciones del mdico.  Trate de comer algunas galletitas secas o tostadas  durante la [place], antes de levantarse de la cama.  Evite los alimentos y los Music therapist.  Evite los alimentos grasos y muy condimentados.  Coma 5 o 6 comidas pequeas por da.  No beba mientras coma. [place] comidas.  Para las colaciones, consuma alimentos ricos en protenas, como queso.  Coma o succione alimentos que contengan jengibre. El jengibre [employer].  Evite preparar las comidas. El olor de la comida puede quitarle el apetito.  Evite los comprimidos de hierro y las multivitaminas que contengan hierro hasta despus del 3. o 4.mes de Media planner. No obstante, consulte con el mdico antes de dejar de tomar cualquier tipo de comprimido de hierro que le hayan recetado.  SOLICITE ATENCIN MDICA SI:  El dolor abdominal aumenta.  Sufre un dolor intenso de [place].  Tiene problemas de visin.  Pierde peso.  SOLICITE ATENCIN MDICA DE INMEDIATO SI:  No puede retener los lquidos.  Vomita sangre.  Tiene nuseas y vmitos constantes.  Se siente excesivamente dbil.  Siente sed extrema.  Tiene mareos o [employer].  Tiene fiebre o sntomas persistentes durante ms de 2a 3das.  Tiene fiebre y los sntomas empeoran repentinamente.  ASEGRESE DE QUE:  Comprende estas instrucciones.  Controlar su afeccin.  Recibir ayuda de inmediato si no mejora o si empeora.  Esta informacin no tiene Marine scientist el consejo del mdico. Asegrese de hacerle al mdico cualquier pregunta que tenga. Document Released: 09/07/2005 Document Revised: 09/12/2013 Document Reviewed: 04/19/2013 Elsevier Interactive Patient Education  2017 [employer].

## 2018-04-24 NOTE — MAU Provider Note (Signed)
Chief Complaint:  Nausea and Emesis   First Provider Initiated Contact with Patient 04/24/18 1131     HPI: Gabriela Morgan is a 37 y.o. [id number] at [redacted]w[redacted]d who presents to maternity admissions reporting N/V and abd pain. Has been evaluated for the same several times and has been hospitalized once. States Sx are the same. Upon questioning, pt states she improves in the hospital while on meds and Sx return after she goes home. She has only filled one Rx (Phenergan 12.5 mg tabs), not the phenergan suppositories, Zofran, Robinul, Reglan that she has been prescribed. Vomits when she takes the Phenergan tabs.   Rx'd Prometrium since too late to care to start 17-P. CL 4.3 cm on 7/13. On review of previous MAU visits. Pt has dropped from 145 to 118 lb (18% weight loss in ~5 weeks).   Location: Mid abd Quality: aching, tender Severity: 10/10 in pain scale Duration: Weeks Context: [redacted] weeks gestation. Hx PTD and multiple fibroids Timing: intermittent Modifying factors: Resolves w/ Dilaudid, Fentanyl Associated signs and symptoms: Pos for N/V. Neg for VB, LOF, vaginal discharge, urinary complaints, fever, chills. Good fetal movement.   Pregnancy Course: LTC at [facility].   Live interpreter used.   Past Medical History:  Diagnosis Date  . Preterm labor   . Uterine fibroid    OB History  Gravida Para Term Preterm AB Living  4 3 1 2  0 3  SAB TAB Ectopic Multiple Live Births  0 0 0 0 3    # Outcome Date GA Lbr Len/2nd Weight Sex Delivery Anes PTL Lv  4 Current           3 Preterm 2014   3 lb (1.361 kg) M Vag-Spont   LIV  2 Preterm 2010   4 lb 8 oz (2.041 kg) F Vag-Spont   LIV  1 Term 2005   7 lb 8 oz (3.402 kg) M Vag-Spont   LIV   Past Surgical History:  Procedure Laterality Date  . APPENDECTOMY  2015   in [place]   Family History  Problem Relation Age of Onset  . Diabetes Mother   . Diabetes Brother    Social History   Tobacco Use  . Smoking status: Never Smoker  . Smokeless  tobacco: Never Used  Substance Use Topics  . Alcohol use: Not Currently  . Drug use: Not Currently   No Known Allergies Medications Prior to Admission  Medication Sig Dispense Refill Last Dose  . glycopyrrolate (ROBINUL) 1 MG tablet Take 1 tablet (1 mg total) by mouth 3 (three) times daily. 90 tablet 0   . metoCLOPramide (REGLAN) 10 MG tablet Take 1 tablet (10 mg total) by mouth every 6 (six) hours as needed for nausea (nausea/headache). 6 tablet 0   . ondansetron (ZOFRAN ODT) 4 MG disintegrating tablet Take 1 tablet (4 mg total) by mouth every 8 (eight) hours as needed for nausea or vomiting. When phenergan or reglan don't work 20 tablet 0   . Prenatal Vit-Fe Fumarate-FA (PRENATAL MULTIVITAMIN) TABS tablet Take 1 tablet by mouth daily at 12 noon.   Not Taking  . progesterone (PROMETRIUM) 200 MG capsule Place one capsule vaginally at bedtime 30 capsule 3   . promethazine (PHENERGAN) 25 MG suppository Place 1 suppository (25 mg total) rectally every 6 (six) hours as needed for nausea or vomiting. USE RECTALLY AT BEDTIME 12 each 3   . ranitidine (ZANTAC) 150 MG tablet Take 1 tablet (150 mg total) by mouth 2 (two) times  daily. 60 tablet 0 Taking    I have reviewed patient's Past Medical Hx, Surgical Hx, Family Hx, Social Hx, medications and allergies.   ROS:  Review of Systems  Constitutional: Negative for appetite change, chills and fever.  Gastrointestinal: Positive for abdominal pain, nausea and vomiting. Negative for abdominal distention, constipation and diarrhea.  Genitourinary: Negative for difficulty urinating, dysuria, flank pain, frequency, hematuria, pelvic pain, urgency, vaginal bleeding and vaginal discharge.  Musculoskeletal: Negative for back pain.    Physical Exam   Patient Vitals for the past 24 hrs:  BP Temp Temp src Pulse Resp SpO2 Height Weight  04/24/18 1409 108/64 98.1 F (36.7 C) Oral 99 16 - - -  04/24/18 1130 - - - - - 99 % - -  04/24/18 1125 - - - - - 98 % - -   04/24/18 1120 - - - - - 98 % - -  04/24/18 1115 - - - - - 99 % - -  04/24/18 1016 - - - - - 99 % - -  04/24/18 1012 - 98.2 F (36.8 C) Oral - 18 - - -  04/24/18 1007 - - - - - - 5' 3.39" (1.61 m) 118 lb 8 oz (53.8 kg)   Constitutional: Well-developed, well-nourished female in mild distress.  Cardiovascular: normal rate Respiratory: normal effort GI: Abd soft, Mild TTP over fundus, possibly fibroid, gravid appropriate for gestational age. Pos BS x 4 MS: Extremities nontender, no edema, normal ROM Neurologic: Alert and oriented x 4.  GU: Neg CVAT.  Pelvic: NEFG, physiologic discharge, no blood, cervix clean. No CMT  Dilation: Fingertip Effacement (%): Thick Cervical Position: Posterior Exam by:: Tamala Julian, [place] CNM  FHT:  Baseline 150 , moderate variability, accelerations present, no decelerations Contractions: None   Labs: Results for orders placed or performed during the hospital encounter of 04/24/18 (from the past 24 hour(s))  Urinalysis, Routine w reflex microscopic     Status: Abnormal   Collection Time: 04/24/18 10:09 AM  Result Value Ref Range   Color, Urine AMBER (A) YELLOW   APPearance HAZY (A) CLEAR   Specific Gravity, Urine 1.017 1.005 - 1.030   pH 6.0 5.0 - 8.0   Glucose, UA NEGATIVE NEGATIVE mg/dL   Hgb urine dipstick SMALL (A) NEGATIVE   Bilirubin Urine NEGATIVE NEGATIVE   Ketones, ur 80 (A) NEGATIVE mg/dL   Protein, ur 30 (A) NEGATIVE mg/dL   Nitrite NEGATIVE NEGATIVE   Leukocytes, UA TRACE (A) NEGATIVE   RBC / HPF 0-5 0 - 5 RBC/hpf   WBC, UA 6-10 0 - 5 WBC/hpf   Bacteria, UA RARE (A) NONE SEEN   Squamous Epithelial / LPF 0-5 0 - 5   Mucus PRESENT   Fetal fibronectin     Status: None   Collection Time: 04/24/18 11:58 AM  Result Value Ref Range   Fetal Fibronectin NEGATIVE NEGATIVE    Imaging:  None  MAU Course: Orders Placed This Encounter  Procedures  . Culture, OB Urine  . Urinalysis, Routine w reflex microscopic  . Fetal fibronectin    Meds ordered this encounter  Medications  . promethazine (PHENERGAN) 25 mg in dextrose 5% lactated ringers 1,000 mL infusion  . ketorolac (TORADOL) 30 MG/ML injection 30 mg  . promethazine (PHENERGAN) 25 MG tablet    Sig: Take 1 tablet (25 mg total) by mouth every 6 (six) hours as needed.    Dispense:  30 tablet    Refill:  1    Order Specific  Question:   Supervising Provider    Answer:   Lavonia Drafts [[id number]]   Pain resolved w/ Toradol. Feeling much better after IV phenergan and fluid bolus. Tolerating PO fluids.   Lengthy discussion w/ pt via live interpreter about obtaining meds. It is uncertain if pt has not gotten them because of transportation issues and lack of understanding of their importance. States a friend helps her get them. No complaint of cost being an issue. Emphasized the importance of obtaining the meds. Instructed pt that she can use two phenergan tabs vaginal or rectally Q6 hours if she has too much N/V to keep them down orally. Recommend overlapping Phenergan and Zofran.    Discussed Hx, labs, exam, weight loss w/ Dr. Ihor Dow. Agrees w/ POC. New orders: Ensure w/ each meal. Will see if pt is doing better at appointment latter this week of is she requires greater intervention to help w/ wt loss/help obtaining meds.   MDM: - Abd pain at site of Fibroid and possibly 2/2 frequent vomiting. May use IBU sparingly until 2 weeks - Hyperemesis w/ significant wt loss. Lengthy discussion about importance of and access to meds.    Assessment: 1. Hyperemesis   2. Abdominal pain during pregnancy in second trimester   3. Unintended weight loss   4. Dehydration     Plan: Discharge home in stable condition.  Hyperemesis precautions Take meds on schedule overlapping Phenergan and Zofran. May use Phenergan rectally/or vaginally. Preterm Labor precautions and fetal kick counts Follow-up [facility] [place] Follow up on 04/29/2018.    Specialty:  Obstetrics and Gynecology Why:  as scheduled Contact information: [place] [facility] up.   Why:  as needed in emergencies Contact information: [address] [phone] [place] [phone]          Allergies as of 04/24/2018   No Known Allergies     Medication List    TAKE these medications   glycopyrrolate 1 MG tablet Commonly known as:  ROBINUL Take 1 tablet (1 mg total) by mouth 3 (three) times daily.   metoCLOPramide 10 MG tablet Commonly known as:  REGLAN Take 1 tablet (10 mg total) by mouth every 6 (six) hours as needed for nausea (nausea/headache).   ondansetron 4 MG disintegrating tablet Commonly known as:  ZOFRAN ODT Take 1 tablet (4 mg total) by mouth every 8 (eight) hours as needed for nausea or vomiting. When phenergan or reglan don't work   prenatal multivitamin Tabs tablet Take 1 tablet by mouth daily at 12 noon.   progesterone 200 MG capsule Commonly known as:  PROMETRIUM Place one capsule vaginally at bedtime   promethazine 25 MG suppository Commonly known as:  PHENERGAN Place 1 suppository (25 mg total) rectally every 6 (six) hours as needed for nausea or vomiting. USE RECTALLY AT BEDTIME What changed:  Another medication with the same name was added. Make sure you understand how and when to take each.   promethazine 25 MG tablet Commonly known as:  PHENERGAN Take 1 tablet (25 mg total) by mouth every 6 (six) hours as needed. What changed:  You were already taking a medication with the same name, and this prescription was added. Make sure you understand how and when to take each.   ranitidine 150 MG tablet Commonly known as:  ZANTAC Take 1 tablet (150 mg  total) by mouth 2 (two) times daily.       Tamala Julian, [place], [place] 04/24/2018 2:55 PM

## 2018-04-26 LAB — CULTURE, OB URINE
Culture: NO GROWTH
SPECIAL REQUESTS: NORMAL

## 2018-04-29 ENCOUNTER — Encounter: Payer: Self-pay | Admitting: Family Medicine

## 2018-04-29 ENCOUNTER — Telehealth: Payer: Self-pay | Admitting: Family Medicine

## 2018-04-29 NOTE — Telephone Encounter (Signed)
Patient called to say she was not able to make her appointment due to transportation. Called with an Interpreter to get her rescheduled.

## 2018-05-07 ENCOUNTER — Inpatient Hospital Stay (HOSPITAL_COMMUNITY)
Admission: AD | Admit: 2018-05-07 | Discharge: 2018-05-07 | Disposition: A | Discharge status: Home / Self Care | Payer: Self-pay | Source: Ambulatory Visit | Attending: Family Medicine | Admitting: Family Medicine

## 2018-05-07 ENCOUNTER — Encounter (HOSPITAL_COMMUNITY): Payer: Self-pay | Admitting: *Deleted

## 2018-05-07 DIAGNOSIS — R109 Unspecified abdominal pain: Secondary | ICD-10-CM | POA: Insufficient documentation

## 2018-05-07 DIAGNOSIS — D259 Leiomyoma of uterus, unspecified: Secondary | ICD-10-CM | POA: Insufficient documentation

## 2018-05-07 DIAGNOSIS — E860 Dehydration: Secondary | ICD-10-CM

## 2018-05-07 DIAGNOSIS — O26892 Other specified pregnancy related conditions, second trimester: Secondary | ICD-10-CM

## 2018-05-07 DIAGNOSIS — Z3A27 27 weeks gestation of pregnancy: Secondary | ICD-10-CM | POA: Insufficient documentation

## 2018-05-07 DIAGNOSIS — O219 Vomiting of pregnancy, unspecified: Secondary | ICD-10-CM

## 2018-05-07 DIAGNOSIS — O3412 Maternal care for benign tumor of corpus uteri, second trimester: Secondary | ICD-10-CM | POA: Insufficient documentation

## 2018-05-07 DIAGNOSIS — O212 Late vomiting of pregnancy: Secondary | ICD-10-CM | POA: Insufficient documentation

## 2018-05-07 LAB — URINALYSIS, ROUTINE W REFLEX MICROSCOPIC
Bacteria, UA: NONE SEEN
Bilirubin Urine: NEGATIVE
GLUCOSE, UA: NEGATIVE mg/dL
Hgb urine dipstick: NEGATIVE
Ketones, ur: 80 mg/dL — AB
LEUKOCYTES UA: NEGATIVE
NITRITE: NEGATIVE
PH: 9 — AB (ref 5.0–8.0)
Protein, ur: 30 mg/dL — AB
SPECIFIC GRAVITY, URINE: 1.016 (ref 1.005–1.030)

## 2018-05-07 MED ORDER — ONDANSETRON 8 MG PO TBDP
8.0000 mg | ORAL_TABLET | Freq: Once | ORAL | Status: AC
  Administered 2018-05-07: 8 mg via ORAL
  Filled 2018-05-07: qty 1

## 2018-05-07 MED ORDER — LACTATED RINGERS IV BOLUS
1000.0000 mL | Freq: Once | INTRAVENOUS | Status: AC
Start: 2018-05-07 — End: 2018-05-07
  Administered 2018-05-07: 1000 mL via INTRAVENOUS

## 2018-05-07 MED ORDER — SCOPOLAMINE 1 MG/3DAYS TD PT72: 1.0000 | MEDICATED_PATCH | TRANSDERMAL | 12 refills | Status: DC

## 2018-05-07 MED ORDER — KETOROLAC TROMETHAMINE 60 MG/2ML IM SOLN
60.0000 mg | Freq: Once | INTRAMUSCULAR | Status: AC
  Administered 2018-05-07: 60 mg via INTRAMUSCULAR
  Filled 2018-05-07: qty 2

## 2018-05-07 MED ORDER — SCOPOLAMINE 1 MG/3DAYS TD PT72
1.0000 | MEDICATED_PATCH | TRANSDERMAL | Status: DC
  Administered 2018-05-07: 1.5 mg via TRANSDERMAL
  Filled 2018-05-07: qty 1

## 2018-05-07 MED ORDER — THIAMINE HCL 100 MG/ML IJ SOLN
Freq: Once | INTRAVENOUS | Status: AC
  Administered 2018-05-07: 15:00:00 via INTRAVENOUS
  Filled 2018-05-07: qty 1000

## 2018-05-07 NOTE — Discharge Instructions (Signed)
Dolor abdominal durante el embarazo (Abdominal Pain During Pregnancy) El dolor de vientre (abdominal) es habitual durante el embarazo. Generalmente no se trata de un problema grave. Otras veces puede ser un signo de que algo no anda bien. Siempre comunquese con su mdico si tiene dolor abdominal. CUIDADOS EN EL HOGAR Controle el dolor para ver si hay cambios. Las indicaciones que siguen pueden ayudarla a sentirse mejor:  Optician, dispensing (relaciones sexuales) ni se coloque nada dentro de la vagina hasta que se sienta mejor.  Haga reposo hasta que el dolor se calme.  Si siente ganas de vomitar (nuseas ) beba lquidos claros. No consuma alimentos slidos hasta que se sienta mejor.  Slo tome los medicamentos que le haya indicado su mdico.  Cumpla con las visitas al mdico segn las indicaciones. SOLICITE AYUDA DE INMEDIATO SI:  Tiene un sangrado, pierde lquido o elimina trozos de tejido por la vagina.  Siente ms dolor o clicos.  Comienza a vomitar.  Siente dolor al orinar u observa sangre en la orina.  Tiene fiebre.  No siente que el beb se mueva mucho.  Se siente muy dbil o cree que va a desmayarse.  Tiene dificultad para respirar con o sin dolor en el vientre.  Siente un dolor de cabeza muy intenso y Social research officer, government en el vientre.  Observa que sale un lquido por la vagina y tiene dolor abdominal.  La materia fecal es lquida (diarrea).  El dolor en el viente no desaparece, o empeora, luego de hacer reposo. ASEGRESE DE QUE:  Comprende estas instrucciones.  Controlar su afeccin.  Recibir ayuda de inmediato si no mejora o si empeora. Esta informacin no tiene Marine scientist el consejo del mdico. Asegrese de hacerle al mdico cualquier pregunta que tenga. Document Released: 05/20/2011 Document Revised: 12/30/2015 Document Reviewed: 04/06/2013 Elsevier Interactive Patient Education  [employer].

## 2018-05-07 NOTE — MAU Note (Addendum)
Pt is here for abdominal pain, vomiting and diarrhea. Diarrhea started last night and is loose. Taking tylenol for pain, pain. 10/10 Pt also states that she had a fall yesterday and landed on her belly.

## 2018-05-07 NOTE — MAU Provider Note (Signed)
History   [id number] at 27 weeks and 1 day and with nausea vomiting and abdominal pain.  This is been a persistent and ongoing problem with this pregnancy for this patient, she has had multiple visits into the MAU for this problem.  The symptoms are the same as with previous visits in the MAU.  She states she is taking her anti-medics as prescribed at home but it is not any better and today there is diarrhea.  CSN: [id number]  Arrival date & time 05/07/18  1238   First Provider Initiated Contact with Patient 05/07/18 1319      Chief Complaint  Patient presents with  . Abdominal Pain  . Emesis  . Diarrhea  . Fall    HPI  Past Medical History:  Diagnosis Date  . Preterm labor   . Uterine fibroid     Past Surgical History:  Procedure Laterality Date  . APPENDECTOMY  2015   in [place]    Family History  Problem Relation Age of Onset  . Diabetes Mother   . Diabetes Brother     Social History   Tobacco Use  . Smoking status: Never Smoker  . Smokeless tobacco: Never Used  Substance Use Topics  . Alcohol use: Not Currently  . Drug use: Not Currently    OB History    Gravida  4   Para  3   Term  1   Preterm  2   AB  0   Living  3     SAB  0   TAB  0   Ectopic  0   Multiple  0   Live Births  3           Review of Systems  Constitutional: Negative.   HENT: Negative.   Eyes: Negative.   Respiratory: Negative.   Cardiovascular: Negative.   Gastrointestinal: Positive for abdominal pain, diarrhea, nausea and vomiting.  Endocrine: Negative.   Genitourinary: Negative.   Musculoskeletal: Negative.   Skin: Negative.   Allergic/Immunologic: Negative.   Neurological: Negative.   Hematological: Negative.   Psychiatric/Behavioral: Negative.     Allergies  Patient has no known allergies.  Home Medications    Pulse 99   Resp 20   Wt 55.8 kg   LMP 11/19/2017 Comment: pt has not been seen yet for this pregnancy   SpO2 96%   BMI 21.52 kg/m    Physical Exam  Constitutional: She is oriented to person, place, and time. She appears well-developed and well-nourished.  HENT:  Head: Normocephalic.  Eyes: Pupils are equal, round, and reactive to light.  Cardiovascular: Normal rate, regular rhythm and normal heart sounds.  Pulmonary/Chest: Effort normal and breath sounds normal.  Abdominal: Soft. Normal appearance. Bowel sounds are increased.  Genitourinary: Vagina normal and uterus normal.  Neurological: She is alert and oriented to person, place, and time.  Skin: Skin is dry.  Psychiatric: She has a normal mood and affect. Her behavior is normal.    MAU Course  Procedures (including critical care time)  Labs Reviewed  URINALYSIS, ROUTINE W REFLEX MICROSCOPIC   No results found.   1. Nausea and vomiting of pregnancy, antepartum   2. Uterine fibroids affecting pregnancy in second trimester   3. Abdominal pain in pregnancy, second trimester       MDM  VSS, FHR 140's no decels, no uc's. SVE FT/post/firm/th/high. Urine 80 of ketones noted will IV hydrate and introduce clear liquids. 15:00 pain improved, no vomiting since zofran. If  tolerates po's will d/c home.

## 2018-05-12 ENCOUNTER — Encounter (HOSPITAL_COMMUNITY): Payer: Self-pay

## 2018-05-12 ENCOUNTER — Inpatient Hospital Stay (HOSPITAL_BASED_OUTPATIENT_CLINIC_OR_DEPARTMENT_OTHER): Payer: Self-pay

## 2018-05-12 ENCOUNTER — Inpatient Hospital Stay (HOSPITAL_COMMUNITY)
Admission: AD | Admit: 2018-05-12 | Discharge: 2018-05-12 | Disposition: A | Discharge status: Home / Self Care | Payer: Self-pay | Source: Ambulatory Visit | Attending: Obstetrics and Gynecology | Admitting: Obstetrics and Gynecology

## 2018-05-12 DIAGNOSIS — R109 Unspecified abdominal pain: Secondary | ICD-10-CM

## 2018-05-12 DIAGNOSIS — Z3689 Encounter for other specified antenatal screening: Secondary | ICD-10-CM

## 2018-05-12 DIAGNOSIS — O09522 Supervision of elderly multigravida, second trimester: Secondary | ICD-10-CM

## 2018-05-12 DIAGNOSIS — O3412 Maternal care for benign tumor of corpus uteri, second trimester: Secondary | ICD-10-CM | POA: Insufficient documentation

## 2018-05-12 DIAGNOSIS — O0932 Supervision of pregnancy with insufficient antenatal care, second trimester: Secondary | ICD-10-CM

## 2018-05-12 DIAGNOSIS — O4702 False labor before 37 completed weeks of gestation, second trimester: Secondary | ICD-10-CM

## 2018-05-12 DIAGNOSIS — D259 Leiomyoma of uterus, unspecified: Secondary | ICD-10-CM

## 2018-05-12 DIAGNOSIS — Z3A27 27 weeks gestation of pregnancy: Secondary | ICD-10-CM

## 2018-05-12 DIAGNOSIS — O210 Mild hyperemesis gravidarum: Secondary | ICD-10-CM | POA: Insufficient documentation

## 2018-05-12 DIAGNOSIS — O26892 Other specified pregnancy related conditions, second trimester: Secondary | ICD-10-CM

## 2018-05-12 DIAGNOSIS — O3410 Maternal care for benign tumor of corpus uteri, unspecified trimester: Secondary | ICD-10-CM

## 2018-05-12 DIAGNOSIS — Z3686 Encounter for antenatal screening for cervical length: Secondary | ICD-10-CM

## 2018-05-12 DIAGNOSIS — O09212 Supervision of pregnancy with history of pre-term labor, second trimester: Secondary | ICD-10-CM

## 2018-05-12 DIAGNOSIS — Z362 Encounter for other antenatal screening follow-up: Secondary | ICD-10-CM

## 2018-05-12 LAB — COMPREHENSIVE METABOLIC PANEL
ALT: 18 U/L (ref 0–44)
AST: 21 U/L (ref 15–41)
Albumin: 4 g/dL (ref 3.5–5.0)
Alkaline Phosphatase: 106 U/L (ref 38–126)
Anion gap: 15 (ref 5–15)
BUN: 8 mg/dL (ref 6–20)
CO2: 24 mmol/L (ref 22–32)
Calcium: 9.9 mg/dL (ref 8.9–10.3)
Chloride: 90 mmol/L — ABNORMAL LOW (ref 98–111)
Creatinine, Ser: 0.44 mg/dL (ref 0.44–1.00)
GFR calc Af Amer: >60 mL/min (ref >60)
GFR calc non Af Amer: >60 mL/min (ref >60)
Glucose, Bld: 81 mg/dL (ref 70–99)
Potassium: 4.8 mmol/L (ref 3.5–5.1)
Sodium: 129 mmol/L — ABNORMAL LOW (ref 135–145)
Total Bilirubin: 1.1 mg/dL (ref 0.3–1.2)
Total Protein: 7.9 g/dL (ref 6.5–8.1)

## 2018-05-12 LAB — DIFFERENTIAL
Basophils Absolute: 0 10*3/uL (ref 0.0–0.1)
Basophils Relative: 0 %
Eosinophils Absolute: 0 10*3/uL (ref 0.0–0.7)
Eosinophils Relative: 0 %
Lymphocytes Relative: 11 %
Lymphs Abs: 1.6 10*3/uL (ref 0.7–4.0)
Monocytes Absolute: 0.9 10*3/uL (ref 0.1–1.0)
Monocytes Relative: 6 %
Neutro Abs: 11.9 10*3/uL — ABNORMAL HIGH (ref 1.7–7.7)
Neutrophils Relative %: 83 %

## 2018-05-12 LAB — URINALYSIS, ROUTINE W REFLEX MICROSCOPIC
Bilirubin Urine: NEGATIVE
Glucose, UA: NEGATIVE mg/dL
Hgb urine dipstick: NEGATIVE
Ketones, ur: 80 mg/dL — AB
Leukocytes, UA: NEGATIVE
Nitrite: NEGATIVE
Protein, ur: 100 mg/dL — AB
Specific Gravity, Urine: 1.026 (ref 1.005–1.030)
pH: 5 (ref 5.0–8.0)

## 2018-05-12 LAB — CBC
HCT: 35.6 % — ABNORMAL LOW (ref 36.0–46.0)
Hemoglobin: 11.8 g/dL — ABNORMAL LOW (ref 12.0–15.0)
MCH: 28.1 pg (ref 26.0–34.0)
MCHC: 33.1 g/dL (ref 30.0–36.0)
MCV: 84.8 fL (ref 78.0–100.0)
Platelets: 392 10*3/uL (ref 150–400)
RBC: 4.2 MIL/uL (ref 3.87–5.11)
RDW: 13.2 % (ref 11.5–15.5)
WBC: 15.1 10*3/uL — ABNORMAL HIGH (ref 4.0–10.5)

## 2018-05-12 LAB — ABO/RH: ABO/RH(D): B POS

## 2018-05-12 LAB — WET PREP, GENITAL
Clue Cells Wet Prep HPF POC: NONE SEEN
Sperm: NONE SEEN
Trich, Wet Prep: NONE SEEN
Yeast Wet Prep HPF POC: NONE SEEN

## 2018-05-12 LAB — TYPE AND SCREEN
ABO/RH(D): B POS
Antibody Screen: NEGATIVE

## 2018-05-12 LAB — FETAL FIBRONECTIN: Fetal Fibronectin: NEGATIVE

## 2018-05-12 LAB — OB RESULTS CONSOLE GBS: STREP GROUP B AG: POSITIVE

## 2018-05-12 MED ORDER — PROCHLORPERAZINE EDISYLATE 10 MG/2ML IJ SOLN
10.0000 mg | Freq: Once | INTRAMUSCULAR | Status: AC
  Administered 2018-05-12: 10 mg via INTRAVENOUS
  Filled 2018-05-12: qty 2

## 2018-05-12 MED ORDER — PROGESTERONE MICRONIZED 200 MG PO CAPS
200.0000 mg | ORAL_CAPSULE | Freq: Once | ORAL | Status: AC
  Administered 2018-05-12: 200 mg via VAGINAL
  Filled 2018-05-12: qty 1

## 2018-05-12 MED ORDER — CYCLOBENZAPRINE HCL 10 MG PO TABS
10.0000 mg | ORAL_TABLET | Freq: Once | ORAL | Status: AC
  Administered 2018-05-12: 10 mg via ORAL
  Filled 2018-05-12: qty 1

## 2018-05-12 MED ORDER — M.V.I. ADULT IV INJ
Freq: Once | INTRAVENOUS | Status: AC
  Administered 2018-05-12: 15:00:00 via INTRAVENOUS
  Filled 2018-05-12: qty 10

## 2018-05-12 MED ORDER — FAMOTIDINE IN NACL 20-0.9 MG/50ML-% IV SOLN
20.0000 mg | Freq: Once | INTRAVENOUS | Status: AC
  Administered 2018-05-12: 20 mg via INTRAVENOUS
  Filled 2018-05-12: qty 50

## 2018-05-12 MED ORDER — LACTATED RINGERS IV SOLN
INTRAVENOUS | Status: DC
  Administered 2018-05-12: 16:00:00 via INTRAVENOUS

## 2018-05-12 MED ORDER — NIFEDIPINE 10 MG PO CAPS
10.0000 mg | ORAL_CAPSULE | ORAL | Status: DC | PRN
  Administered 2018-05-12 (×3): 10 mg via ORAL
  Filled 2018-05-12 (×3): qty 1

## 2018-05-12 MED ORDER — LACTATED RINGERS IV BOLUS
1000.0000 mL | Freq: Once | INTRAVENOUS | Status: AC
  Administered 2018-05-12: 1000 mL via INTRAVENOUS

## 2018-05-12 MED ORDER — SODIUM CHLORIDE 0.9 % IV SOLN: 8.0000 mg | Freq: Once | INTRAVENOUS | Status: DC

## 2018-05-12 MED ORDER — BETAMETHASONE SOD PHOS & ACET 6 (3-3) MG/ML IJ SUSP
12.0000 mg | Freq: Once | INTRAMUSCULAR | Status: AC
  Administered 2018-05-12: 12 mg via INTRAMUSCULAR
  Filled 2018-05-12: qty 2

## 2018-05-12 MED ORDER — PROGESTERONE MICRONIZED 200 MG PO CAPS: ORAL_CAPSULE | ORAL | 2 refills | Status: DC

## 2018-05-12 NOTE — MAU Note (Signed)
Pt states she's still having back and abdominal pain and vomiting. Pain 10/10. Taking tylenol

## 2018-05-12 NOTE — MAU Provider Note (Signed)
History     CSN: [id number]  Arrival date and time: 05/12/18 1237   First Provider Initiated Contact with Patient 05/12/18 1314      Chief Complaint  Patient presents with  . Abdominal Pain  . Emesis   Abdominal Pain  This is a recurrent problem. The current episode started more than 1 month ago. The onset quality is gradual. The problem occurs intermittently. The problem has been gradually worsening. The pain is located in the suprapubic region. The pain is at a severity of 10/10. The quality of the pain is cramping and aching (contractions). The abdominal pain radiates to the back. Associated symptoms include nausea, vomiting and weight loss. Pertinent negatives include no constipation, diarrhea, dysuria, fever or frequency. Nothing aggravates the pain. The pain is relieved by nothing. She has tried acetaminophen for the symptoms. The treatment provided no relief. hx of PTL and PTD with last two pregnancies  Emesis   This is a recurrent problem. The current episode started 1 to 4 weeks ago. The problem occurs 5 to 10 times per day. The problem has been unchanged. The emesis has an appearance of stomach contents. There has been no fever. Associated symptoms include abdominal pain and weight loss. Pertinent negatives include no chills, diarrhea or fever. She has tried sleep (she has not taken medication prescribed for nausea and vomiting) for the symptoms. The treatment provided no relief.    OB History    Gravida  4   Para  3   Term  1   Preterm  2   AB  0   Living  3     SAB  0   TAB  0   Ectopic  0   Multiple  0   Live Births  3        Obstetric Comments  2014 approx 28 weeks 2010 approx 30 weeks        Past Medical History:  Diagnosis Date  . Preterm labor   . Uterine fibroid     Past Surgical History:  Procedure Laterality Date  . APPENDECTOMY  2015   in [place]    Family History  Problem Relation Age of Onset  . Diabetes Mother   . Diabetes  Brother     Social History   Tobacco Use  . Smoking status: Never Smoker  . Smokeless tobacco: Never Used  Substance Use Topics  . Alcohol use: Not Currently  . Drug use: Not Currently    Allergies: No Known Allergies  Medications Prior to Admission  Medication Sig Dispense Refill Last Dose  . acetaminophen (TYLENOL) 325 MG tablet Take 650 mg by mouth every 6 (six) hours as needed for moderate pain.   05/11/2018 at Unknown time  . Prenatal Vit-Fe Fumarate-FA (PRENATAL MULTIVITAMIN) TABS tablet Take 1 tablet by mouth daily at 12 noon.   05/11/2018 at Unknown time  . scopolamine (TRANSDERM-SCOP, 1.5 MG,) 1 MG/3DAYS Place 1 patch (1.5 mg total) onto the skin every 3 (three) days. 10 patch 12 05/11/2018 at Unknown time  . glycopyrrolate (ROBINUL) 1 MG tablet Take 1 tablet (1 mg total) by mouth 3 (three) times daily. (Patient not taking: Reported on 05/12/2018) 90 tablet 0 Not Taking at Unknown time  . metoCLOPramide (REGLAN) 10 MG tablet Take 1 tablet (10 mg total) by mouth every 6 (six) hours as needed for nausea (nausea/headache). (Patient not taking: Reported on 05/12/2018) 6 tablet 0 Not Taking at Unknown time  . ondansetron (ZOFRAN ODT) 4 MG  disintegrating tablet Take 1 tablet (4 mg total) by mouth every 8 (eight) hours as needed for nausea or vomiting. When phenergan or reglan don't work (Patient not taking: Reported on 05/12/2018) 20 tablet 0 Not Taking at Unknown time  . promethazine (PHENERGAN) 25 MG suppository Place 1 suppository (25 mg total) rectally every 6 (six) hours as needed for nausea or vomiting. USE RECTALLY AT BEDTIME (Patient not taking: Reported on 05/12/2018) 12 each 3 Not Taking at Unknown time  . promethazine (PHENERGAN) 25 MG tablet Take 1 tablet (25 mg total) by mouth every 6 (six) hours as needed. (Patient not taking: Reported on 05/12/2018) 30 tablet 1 Not Taking at Unknown time  . ranitidine (ZANTAC) 150 MG tablet Take 1 tablet (150 mg total) by mouth 2 (two) times daily.  (Patient not taking: Reported on 05/12/2018) 60 tablet 0 Not Taking at Unknown time  . [DISCONTINUED] progesterone (PROMETRIUM) 200 MG capsule Place one capsule vaginally at bedtime (Patient not taking: Reported on 05/12/2018) 30 capsule 3 Not Taking at Unknown time    Review of Systems  Constitutional: Positive for weight loss. Negative for chills and fever.  Respiratory: Negative.   Cardiovascular: Negative.   Gastrointestinal: Positive for abdominal pain, nausea and vomiting. Negative for constipation and diarrhea.  Genitourinary: Negative for decreased urine volume, difficulty urinating, dysuria, frequency, pelvic pain, vaginal bleeding and vaginal discharge.  Musculoskeletal: Positive for back pain.  Neurological: Negative.    Physical Exam   Blood pressure 110/69, pulse 88, temperature 98.3 F (36.8 C), temperature source Oral, resp. rate 20, weight 53.5 kg, last menstrual period 11/19/2017, SpO2 99 %, unknown if currently breastfeeding.  Physical Exam  Constitutional: She is oriented to person, place, and time. She appears well-developed. She appears distressed.  HENT:  Head: Normocephalic.  Cardiovascular: Normal rate, regular rhythm and normal heart sounds.  Respiratory: Effort normal and breath sounds normal. No respiratory distress. She has no wheezes.  GI: Soft. There is tenderness. There is no rebound and no guarding.  Genitourinary: No bleeding in the vagina. No vaginal discharge found.  Musculoskeletal: Normal range of motion. She exhibits no edema.  Neurological: She is alert and oriented to person, place, and time.  Skin: Skin is warm and dry.  Psychiatric: She has a normal mood and affect. Her behavior is normal. Thought content normal.   CERVICAL EXAM:  Dilation: 2 Effacement (%): 40 Cervical Position: Posterior Presentation: Vertex Exam by:: Wende Bushy CNM  FHR: 150/moderate varibility/+accels/no decel TOCO: irregular mild contractions with UI  MAU Course   Procedures Results for orders placed or performed during the hospital encounter of 05/12/18 (from the past 24 hour(s))  Urinalysis, Routine w reflex microscopic     Status: Abnormal   Collection Time: 05/12/18  1:14 PM  Result Value Ref Range   Color, Urine YELLOW YELLOW   APPearance CLEAR CLEAR   Specific Gravity, Urine 1.026 1.005 - 1.030   pH 5.0 5.0 - 8.0   Glucose, UA NEGATIVE NEGATIVE mg/dL   Hgb urine dipstick NEGATIVE NEGATIVE   Bilirubin Urine NEGATIVE NEGATIVE   Ketones, ur 80 (A) NEGATIVE mg/dL   Protein, ur 100 (A) NEGATIVE mg/dL   Nitrite NEGATIVE NEGATIVE   Leukocytes, UA NEGATIVE NEGATIVE   RBC / HPF 0-5 0 - 5 RBC/hpf   WBC, UA 0-5 0 - 5 WBC/hpf   Bacteria, UA RARE (A) NONE SEEN   Squamous Epithelial / LPF 0-5 0 - 5   Mucus PRESENT   Culture, OB Urine  Status: Abnormal   Collection Time: 05/12/18  1:14 PM  Result Value Ref Range   Specimen Description      OB CLEAN CATCH Performed at [facility], [address], [place], [place] [address]    Special Requests      NONE Performed at [facility], [place], [place] [address]    Culture (A)     <10,000 COLONIES/mL INSIGNIFICANT GROWTH NO GROUP B STREP (S.AGALACTIAE) ISOLATED Performed at [facility] [address] [place], [place] [address]    Report Status 05/13/2018 FINAL   Fetal fibronectin     Status: None   Collection Time: 05/12/18  1:24 PM  Result Value Ref Range   Fetal Fibronectin NEGATIVE NEGATIVE  CBC     Status: Abnormal   Collection Time: 05/12/18  1:57 PM  Result Value Ref Range   WBC 15.1 (H) 4.0 - 10.5 K/uL   RBC 4.20 3.87 - 5.11 MIL/uL   Hemoglobin 11.8 (L) 12.0 - 15.0 g/dL   HCT 35.6 (L) 36.0 - 46.0 %   MCV 84.8 78.0 - 100.0 fL   MCH 28.1 26.0 - 34.0 pg   MCHC 33.1 30.0 - 36.0 g/dL   RDW 13.2 11.5 - 15.5 %   Platelets 392 150 - 400 K/uL  Type and screen [place]     Status: None   Collection Time: 05/12/18  1:57 PM  Result  Value Ref Range   ABO/RH(D) B POS    Antibody Screen NEG    Sample Expiration      05/15/2018 Performed at [facility], [address], [place], [place] [address]   ABO/Rh     Status: None   Collection Time: 05/12/18  1:57 PM  Result Value Ref Range   ABO/RH(D)      B POS Performed at [facility], [address] [place], [place] [address]   Differential     Status: Abnormal   Collection Time: 05/12/18  1:57 PM  Result Value Ref Range   Neutrophils Relative % 83 %   Neutro Abs 11.9 (H) 1.7 - 7.7 K/uL   Lymphocytes Relative 11 %   Lymphs Abs 1.6 0.7 - 4.0 K/uL   Monocytes Relative 6 %   Monocytes Absolute 0.9 0.1 - 1.0 K/uL   Eosinophils Relative 0 %   Eosinophils Absolute 0.0 0.0 - 0.7 K/uL   Basophils Relative 0 %   Basophils Absolute 0.0 0.0 - 0.1 K/uL  Comprehensive metabolic panel     Status: Abnormal   Collection Time: 05/12/18  1:57 PM  Result Value Ref Range   Sodium 129 (L) 135 - 145 mmol/L   Potassium 4.8 3.5 - 5.1 mmol/L   Chloride 90 (L) 98 - 111 mmol/L   CO2 24 22 - 32 mmol/L   Glucose, Bld 81 70 - 99 mg/dL   BUN 8 6 - 20 mg/dL   Creatinine, Ser 0.44 0.44 - 1.00 mg/dL   Calcium 9.9 8.9 - 10.3 mg/dL   Total Protein 7.9 6.5 - 8.1 g/dL   Albumin 4.0 3.5 - 5.0 g/dL   AST 21 15 - 41 U/L   ALT 18 0 - 44 U/L   Alkaline Phosphatase 106 38 - 126 U/L   Total Bilirubin 1.1 0.3 - 1.2 mg/dL   GFR calc non Af Amer >60 >60 mL/min   GFR calc Af Amer >60 >60 mL/min   Anion gap 15 5 - 15  Culture, beta strep (  group b only)     Status: None (Preliminary result)   Collection Time: 05/12/18  6:16 PM  Result Value Ref Range   Specimen Description      VAGINAL/RECTAL Performed at [facility], [address], [place], [place] [address]    Special Requests      NONE Performed at [facility], [address], [place], [place] [address]    Culture      CULTURE REINCUBATED FOR BETTER GROWTH Performed at [facility], [place] [address]  [place], [place] [address]    Report Status PENDING   Wet prep, genital     Status: Abnormal   Collection Time: 05/12/18  6:16 PM  Result Value Ref Range   Yeast Wet Prep HPF POC NONE SEEN NONE SEEN   Trich, Wet Prep NONE SEEN NONE SEEN   Clue Cells Wet Prep HPF POC NONE SEEN NONE SEEN   WBC, Wet Prep HPF POC MANY (A) NONE SEEN   Sperm NONE SEEN    MDM IV LR bolus Banana bag  Procardia x3  Compazine IV  Wet prep  GC/C GBS culture Type and screen CBC CMP Flexeril 10mg  PO BMZ [place] follow up and transvaginal for cervical length  Extensive monitoring with cervix recheck   [place] Mfm Ob Transvaginal  Result Date: 05/12/2018 ----------------------------------------------------------------------  OBSTETRICS REPORT                       (Signed Final 05/12/2018 04:13 pm) ---------------------------------------------------------------------- Patient Info  ID #:       [id number]                          D.O.B.:  [birth year] (37 yrs)  Name:       Gabriela Morgan                Visit Date: 05/12/2018 02:37 pm ---------------------------------------------------------------------- Performed By  Performed By:     Christen Bame Vics             Referred By:      MAU Nursing-                    RDMS,RVT                                 MAU/Triage  Attending:        Tama High MD        Location:         [facility] ---------------------------------------------------------------------- Orders   #  Description                          Code         Ordered By   1  [place] MFM OB FOLLOW UP                  93790.24     [place]   2  [place] MFM OB TRANSVAGINAL               09735.3      Yasseen Salls  ----------------------------------------------------------------------   #  Order #                    Accession #                 Episode #  1  [id number]                  [id number]   2  [id number]                  [id number]                  [id number]   ---------------------------------------------------------------------- Indications   [redacted] weeks gestation of pregnancy                Z3A.27   Late prenatal care, second trimester           O09.32   Pelvic pain affecting pregnancy in second      O26.892   trimester   Uterine fibroids affecting pregnancy in        O34.12, D25.9   second trimester, antepartum   Advanced maternal age multigravida 88+,        O09.522   second trimester   Poor obstetric history: Previous preterm       O09.219   delivery, antepartum x 2   Encounter for other antenatal screening        Z36.2   follow-up   Encounter for cervical length                  Z36.86  ---------------------------------------------------------------------- Vital Signs  Weight (lb): 118                               Height:        5'4"  BMI:         20.25 ---------------------------------------------------------------------- Fetal Evaluation  Num Of Fetuses:         1  Fetal Heart Rate(bpm):  171  Cardiac Activity:       Observed  Presentation:           Cephalic  Amniotic Fluid  AFI FV:      Within normal limits                              Largest Pocket(cm)                              3.1 ---------------------------------------------------------------------- Biometry  BPD:      67.5  mm     G. Age:  27w 1d         18  %    CI:        73.89   %    70 - 86                                                          FL/HC:      20.3   %    18.8 - 20.6  HC:      249.4  mm     G. Age:  27w 0d          7  %    HC/AC:      1.09        1.05 - 1.21  AC:      228.9  mm     G. Age:  27w 2d         25  %    FL/BPD:     75.0   %    71 - 87  FL:       50.6  mm     G. Age:  27w 1d         18  %    FL/AC:      22.1   %    20 - 24  HUM:      47.5  mm     G. Age:  27w 6d         48  %  LV:        3.4  mm  Est. FW:    1041  gm      2 lb 5 oz     37  % ---------------------------------------------------------------------- OB History  Gravidity:    4         Term:   2        Prem:   1         SAB:   0  TOP:          0       Ectopic:  0        Living: 3 ---------------------------------------------------------------------- Gestational Age  LMP:           24w 6d        Date:  11/19/17                 EDD:   08/26/18  U/S Today:     27w 1d                                        EDD:   08/10/18  Best:          27w 6d     Det. By:  U/S  (03/17/18)          EDD:   08/05/18 ---------------------------------------------------------------------- Anatomy  Cranium:               Appears normal         Aortic Arch:            Previously seen  Cavum:                 Previously seen        Ductal Arch:            Previously seen  Ventricles:            Appears normal         Diaphragm:              Previously seen  Choroid Plexus:        Previously seen        Stomach:                Appears normal, left  sided  Cerebellum:            Previously seen        Abdomen:                Previously seen  Posterior Fossa:       Previously seen        Abdominal Wall:         Previously seen  Nuchal Fold:           Previously seen        Cord Vessels:           Previously seen  Face:                  Orbits and profile     Kidneys:                Appear normal                         previously seen  Lips:                  Previously seen        Bladder:                Appears normal  Thoracic:              Appears normal         Spine:                  Previously seen  Heart:                 Previously seen        Upper Extremities:      Previously seen  RVOT:                  Previously seen        Lower Extremities:      Previously seen  LVOT:                  Previously seen  Other:  Fetus appears to be a female prev seen. Heels prev visualized. ---------------------------------------------------------------------- Cervix Uterus Adnexa  Cervix  Length:            3.4  cm.  Normal appearance by transvaginal scan  Uterus  Single fibroid noted, see table  below. ---------------------------------------------------------------------- Myomas   Site                     L(cm)      W(cm)      D(cm)      Location   Anterior                 6.1        6.3        4.4  ----------------------------------------------------------------------   Blood Flow                 RI        PI       Comments  ---------------------------------------------------------------------- Impression  Patient is being evaluated for nausea, vomiting and  abdominal pain.  Amniotic fluid is normal and good fetal activity is seen. Fetal  growth is appropriate for gestational age. On transvaginal  scan, the cervix measures 3.4 cm, which is normal.  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 ---------------------------------------------------------------------- Recommendations  Recommend follow-up growth assessment in 4 weeks. ----------------------------------------------------------------------  Tama High, MD Electronically Signed Final Report   05/12/2018 04:13 pm ----------------------------------------------------------------------  [place] Mfm Ob Follow Up  Result Date: 05/12/2018 ----------------------------------------------------------------------  OBSTETRICS REPORT                       (Signed Final 05/12/2018 04:13 pm) ---------------------------------------------------------------------- Patient Info  ID #:       [id number]                          D.O.B.:  04/23/1981 (37 yrs)  Name:       Gabriela Morgan                Visit Date: 05/12/2018 02:37 pm ---------------------------------------------------------------------- Performed By  Performed By:     Corky Crafts             Referred By:      MAU Nursing-                    RDMS,RVT                                 MAU/Triage  Attending:        Tama High MD        Location:         [facility] ---------------------------------------------------------------------- Orders   #  Description                          Code          Ordered By   1  [place] MFM OB FOLLOW UP                  35329.92     Simara Rhyner   2  [place] MFM OB TRANSVAGINAL               42683.4      Timarion Agcaoili  ----------------------------------------------------------------------   #  Order #                    Accession #                 Episode #   1  [id number]                  [id number]   2  [id number]                  [id number]                  [id number]  ---------------------------------------------------------------------- Indications   [redacted] weeks gestation of pregnancy                Z3A.27   Late prenatal care, second trimester           O09.32   Pelvic pain affecting pregnancy in second      O26.892   trimester   Uterine fibroids affecting pregnancy in        O34.12, D25.9   second trimester, antepartum   Advanced maternal age multigravida 17+,        O30.522   second trimester   Poor obstetric history: Previous preterm       O09.219   delivery, antepartum x 2  Encounter for other antenatal screening        Z36.2   follow-up   Encounter for cervical length                  Z36.86  ---------------------------------------------------------------------- Vital Signs  Weight (lb): 118                               Height:        5'4"  BMI:         20.25 ---------------------------------------------------------------------- Fetal Evaluation  Num Of Fetuses:         1  Fetal Heart Rate(bpm):  171  Cardiac Activity:       Observed  Presentation:           Cephalic  Amniotic Fluid  AFI FV:      Within normal limits                              Largest Pocket(cm)                              3.1 ---------------------------------------------------------------------- Biometry  BPD:      67.5  mm     G. Age:  27w 1d         18  %    CI:        73.89   %    70 - 86                                                          FL/HC:      20.3   %    18.8 - 20.6  HC:      249.4  mm     G. Age:  27w 0d          7  %    HC/AC:      1.09        1.05 - 1.21   AC:      228.9  mm     G. Age:  27w 2d         25  %    FL/BPD:     75.0   %    71 - 87  FL:       50.6  mm     G. Age:  27w 1d         18  %    FL/AC:      22.1   %    20 - 24  HUM:      47.5  mm     G. Age:  27w 6d         48  %  LV:        3.4  mm  Est. FW:    1041  gm      2 lb 5 oz     37  % ---------------------------------------------------------------------- OB History  Gravidity:    4         Term:   2        Prem:   1  SAB:   0  TOP:          0       Ectopic:  0        Living: 3 ---------------------------------------------------------------------- Gestational Age  LMP:           24w 6d        Date:  11/19/17                 EDD:   08/26/18  U/S Today:     27w 1d                                        EDD:   08/10/18  Best:          27w 6d     Det. By:  U/S  (03/17/18)          EDD:   08/05/18 ---------------------------------------------------------------------- Anatomy  Cranium:               Appears normal         Aortic Arch:            Previously seen  Cavum:                 Previously seen        Ductal Arch:            Previously seen  Ventricles:            Appears normal         Diaphragm:              Previously seen  Choroid Plexus:        Previously seen        Stomach:                Appears normal, left                                                                        sided  Cerebellum:            Previously seen        Abdomen:                Previously seen  Posterior Fossa:       Previously seen        Abdominal Wall:         Previously seen  Nuchal Fold:           Previously seen        Cord Vessels:           Previously seen  Face:                  Orbits and profile     Kidneys:                Appear normal                         previously seen  Lips:                  Previously seen  Bladder:                Appears normal  Thoracic:              Appears normal         Spine:                  Previously seen  Heart:                 Previously seen        Upper  Extremities:      Previously seen  RVOT:                  Previously seen        Lower Extremities:      Previously seen  LVOT:                  Previously seen  Other:  Fetus appears to be a female prev seen. Heels prev visualized. ---------------------------------------------------------------------- Cervix Uterus Adnexa  Cervix  Length:            3.4  cm.  Normal appearance by transvaginal scan  Uterus  Single fibroid noted, see table below. ---------------------------------------------------------------------- Myomas   Site                     L(cm)      W(cm)      D(cm)      Location   Anterior                 6.1        6.3        4.4  ----------------------------------------------------------------------   Blood Flow                 RI        PI       Comments  ---------------------------------------------------------------------- Impression  Patient is being evaluated for nausea, vomiting and  abdominal pain.  Amniotic fluid is normal and good fetal activity is seen. Fetal  growth is appropriate for gestational age. On transvaginal  scan, the cervix measures 3.4 cm, which is normal.  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 ---------------------------------------------------------------------- Recommendations  Recommend follow-up growth assessment in 4 weeks. ----------------------------------------------------------------------                  Tama High, MD Electronically Signed Final Report   05/12/2018 04:13 pm ----------------------------------------------------------------------  Patient reports abdominal pain has decreased to 5/10 after treatments in MAU. Reports back pain is now 7/10. On arrival to room for reassessment patient asleep and has to be awaken for cervical exam. Patient reports continued pain when she awakes.   Cervix unchanged after reassessment x2 over 6 hours. Educated and discussed risk of PTL and PTD with patient especially due to extensive hx. Educated on the need to take  medications as prescribed and start going to prenatal visits. Patient verbalizes understanding- reports she is unable to go to pharmacy tonight to pick up prometrium but will pick it up tomorrow.   Prometrium dose given vaginally prior to discharge home. Pt stable at time of discharge- will go to prenatal appointment tomorrow as scheduled and receive second BMZ inj.   Spanish interpreter used throughout visit.  Assessment and Plan   1. Preterm uterine contractions in second trimester, antepartum   2. Encounter for antenatal screening for cervical length   3. Encounter for ultrasound to assess fetal growth   4. Hyperemesis  gravidarum, antepartum   5. [redacted] weeks gestation of pregnancy   6. Abdominal pain during pregnancy in second trimester   7. Uterine fibroid during pregnancy, antepartum    Discharge home  Follow up tomorrow for prenatal visit and second [place]  Discussed reasons to return to [facility]U  Rx for Prometrium  Start back taking nausea medication  Take prometrium as prescribed  Hydration  Pelvic rest   Allergies as of 05/12/2018   No Known Allergies     Medication List    TAKE these medications   acetaminophen 325 MG tablet Commonly known as:  TYLENOL Take 650 mg by mouth every 6 (six) hours as needed for moderate pain.   glycopyrrolate 1 MG tablet Commonly known as:  ROBINUL Take 1 tablet (1 mg total) by mouth 3 (three) times daily.   metoCLOPramide 10 MG tablet Commonly known as:  REGLAN Take 1 tablet (10 mg total) by mouth every 6 (six) hours as needed for nausea (nausea/headache).   ondansetron 4 MG disintegrating tablet Commonly known as:  ZOFRAN-ODT Take 1 tablet (4 mg total) by mouth every 8 (eight) hours as needed for nausea or vomiting. When phenergan or reglan don't work   prenatal multivitamin Tabs tablet Take 1 tablet by mouth daily at 12 noon.   progesterone 200 MG capsule Commonly known as:  PROMETRIUM Place one capsule vaginally at bedtime    promethazine 25 MG suppository Commonly known as:  PHENERGAN Place 1 suppository (25 mg total) rectally every 6 (six) hours as needed for nausea or vomiting. USE RECTALLY AT BEDTIME   promethazine 25 MG tablet Commonly known as:  PHENERGAN Take 1 tablet (25 mg total) by mouth every 6 (six) hours as needed.   ranitidine 150 MG tablet Commonly known as:  ZANTAC Take 1 tablet (150 mg total) by mouth 2 (two) times daily.   scopolamine 1 MG/3DAYS Commonly known as:  TRANSDERM-SCOP Place 1 patch (1.5 mg total) onto the skin every 3 (three) days.      Lajean Manes 05/12/2018, 7:50 PM

## 2018-05-13 ENCOUNTER — Ambulatory Visit (INDEPENDENT_AMBULATORY_CARE_PROVIDER_SITE_OTHER): Payer: Self-pay | Admitting: Obstetrics and Gynecology

## 2018-05-13 ENCOUNTER — Encounter: Payer: Self-pay | Admitting: Obstetrics and Gynecology

## 2018-05-13 ENCOUNTER — Other Ambulatory Visit: Payer: Self-pay

## 2018-05-13 VITALS — BP 100/67 | HR 84 | Wt 121.7 lb

## 2018-05-13 DIAGNOSIS — O09522 Supervision of elderly multigravida, second trimester: Secondary | ICD-10-CM

## 2018-05-13 DIAGNOSIS — Z3480 Encounter for supervision of other normal pregnancy, unspecified trimester: Secondary | ICD-10-CM

## 2018-05-13 DIAGNOSIS — Z789 Other specified health status: Secondary | ICD-10-CM

## 2018-05-13 DIAGNOSIS — O09212 Supervision of pregnancy with history of pre-term labor, second trimester: Secondary | ICD-10-CM

## 2018-05-13 DIAGNOSIS — O09892 Supervision of other high risk pregnancies, second trimester: Secondary | ICD-10-CM

## 2018-05-13 LAB — CULTURE, OB URINE: Culture: <10000 — AB

## 2018-05-13 LAB — GC/CHLAMYDIA PROBE AMP (~~LOC~~) NOT AT ARMC
Chlamydia: NEGATIVE
Neisseria Gonorrhea: NEGATIVE

## 2018-05-13 MED ORDER — BETAMETHASONE SOD PHOS & ACET 6 (3-3) MG/ML IJ SUSP
12.0000 mg | Freq: Once | INTRAMUSCULAR | Status: AC
  Administered 2018-05-13: 12 mg via INTRAMUSCULAR

## 2018-05-13 NOTE — Progress Notes (Signed)
Pt seen @ MAU yesterday due to UC's - received Betamethasone.

## 2018-05-13 NOTE — Progress Notes (Signed)
   PRENATAL VISIT NOTE  Subjective:  Gabriela Morgan is a 37 y.o. [id number] at [redacted]w[redacted]d being seen today for ongoing prenatal care.  She is currently monitored for the following issues for this high-risk pregnancy and has Hyperemesis affecting pregnancy, antepartum; Supervision of other normal pregnancy, antepartum; Limited prenatal care in second trimester; Uterine fibroids affecting pregnancy in second trimester; Language barrier; Hx of preterm delivery, currently pregnant, second trimester; AMA (advanced maternal age) multigravida 69+, second trimester; Abdominal pain in pregnancy, second trimester; and Dehydration on their problem list.  Patient reports she is worried that baby will be born premature. Not having contractions today.  Contractions: Irregular. Vag. Bleeding: None.  Movement: Present. Denies leaking of fluid.   The following portions of the patient's history were reviewed and updated as appropriate: allergies, current medications, past family history, past medical history, past social history, past surgical history and problem list. Problem list updated.  Objective:   Vitals:   05/13/18 1125  BP: 100/67  Pulse: 84  Weight: 121 lb 11.2 oz (55.2 kg)    Fetal Status: Fetal Heart Rate (bpm): 152   Movement: Present     General:  Alert, oriented and cooperative. Patient is in no acute distress.  Skin: Skin is warm and dry. No rash noted.   Cardiovascular: Normal heart rate noted  Respiratory: Normal respiratory effort, no problems with respiration noted  Abdomen: Soft, gravid, appropriate for gestational age.  Pain/Pressure: Present     Pelvic: Cervical exam deferred        Extremities: Normal range of motion.     Mental Status: Normal mood and affect. Normal behavior. Normal judgment and thought content.   Assessment and Plan:  Pregnancy: [id number] at [redacted]w[redacted]d  1. Supervision of other normal pregnancy, antepartum  2. Language barrier Patent attorney used  3. Hx of preterm  delivery, currently pregnant, second trimester Too late for 17P, was not taking vaginal progesterone prior to yesterday Has started to take vaginal progesterone again, reports some discharge this am she thinks is due to progesterone  4. AMA (advanced maternal age) multigravida 81+, second trimester  5. Pre-term contractions Seen yesterday in MAU for pre-term contractions Will get 2nd [place] today  Preterm labor symptoms and general obstetric precautions including but not limited to vaginal bleeding, contractions, leaking of fluid and fetal movement were reviewed in detail with the patient. Please refer to After Visit Summary for other counseling recommendations.  Return in about 2 weeks (around 05/27/2018) for OB visit (MD), 2 hr GTT, 3rd trim labs.  No future appointments.  [name], MD

## 2018-05-13 NOTE — Addendum Note (Signed)
Addended by: Langston Reusing on: 05/13/2018 12:45 PM   Modules accepted: Orders

## 2018-05-14 LAB — CULTURE, BETA STREP (GROUP B ONLY)

## 2018-05-15 ENCOUNTER — Encounter: Payer: Self-pay | Admitting: Advanced Practice Midwife

## 2018-05-15 DIAGNOSIS — O99820 Streptococcus B carrier state complicating pregnancy: Secondary | ICD-10-CM | POA: Insufficient documentation

## 2018-05-16 ENCOUNTER — Other Ambulatory Visit: Payer: Self-pay

## 2018-05-27 ENCOUNTER — Ambulatory Visit (INDEPENDENT_AMBULATORY_CARE_PROVIDER_SITE_OTHER): Payer: Self-pay | Admitting: Obstetrics & Gynecology

## 2018-05-27 VITALS — BP 119/84 | HR 86 | Wt 116.2 lb

## 2018-05-27 DIAGNOSIS — O09212 Supervision of pregnancy with history of pre-term labor, second trimester: Secondary | ICD-10-CM

## 2018-05-27 DIAGNOSIS — O09522 Supervision of elderly multigravida, second trimester: Secondary | ICD-10-CM

## 2018-05-27 DIAGNOSIS — Z3480 Encounter for supervision of other normal pregnancy, unspecified trimester: Secondary | ICD-10-CM

## 2018-05-27 DIAGNOSIS — O3412 Maternal care for benign tumor of corpus uteri, second trimester: Secondary | ICD-10-CM

## 2018-05-27 DIAGNOSIS — D259 Leiomyoma of uterus, unspecified: Secondary | ICD-10-CM

## 2018-05-27 DIAGNOSIS — Z23 Encounter for immunization: Secondary | ICD-10-CM

## 2018-05-27 DIAGNOSIS — Z789 Other specified health status: Secondary | ICD-10-CM

## 2018-05-27 DIAGNOSIS — O26843 Uterine size-date discrepancy, third trimester: Secondary | ICD-10-CM

## 2018-05-27 DIAGNOSIS — O09892 Supervision of other high risk pregnancies, second trimester: Secondary | ICD-10-CM

## 2018-05-27 NOTE — Progress Notes (Signed)
   PRENATAL VISIT NOTE  Subjective:  Gabriela Morgan is a 37 y.o. [id number] at [redacted]w[redacted]d being seen today for ongoing prenatal care.  She is currently monitored for the following issues for this high-risk pregnancy and has Hyperemesis affecting pregnancy, antepartum; Supervision of other normal pregnancy, antepartum; Limited prenatal care in second trimester; Uterine fibroids affecting pregnancy in second trimester; Language barrier; Hx of preterm delivery, currently pregnant, second trimester; AMA (advanced maternal age) multigravida 88+, second trimester; Abdominal pain in pregnancy, second trimester; Dehydration; and Group B Streptococcus carrier, antepartum on their problem list.  Patient reports regular contractions for a week.  Contractions: Regular. Vag. Bleeding: None.  Movement: Present. Denies leaking of fluid.   The following portions of the patient's history were reviewed and updated as appropriate: allergies, current medications, past family history, past medical history, past social history, past surgical history and problem list. Problem list updated.  Objective:   Vitals:   05/27/18 1116  BP: 119/84  Pulse: 86  Weight: 116 lb 3.2 oz (52.7 kg)    Fetal Status: Fetal Heart Rate (bpm): 161   Movement: Present     General:  Alert, oriented and cooperative. Patient is in no acute distress.  Skin: Skin is warm and dry. No rash noted.   Cardiovascular: Normal heart rate noted  Respiratory: Normal respiratory effort, no problems with respiration noted  Abdomen: Soft, gravid, appropriate for gestational age.  Pain/Pressure: Present     Pelvic: Cervical exam performed        Extremities: Normal range of motion.     Mental Status: Normal mood and affect. Normal behavior. Normal judgment and thought content.   Assessment and Plan:  Pregnancy: [id number] at [redacted]w[redacted]d  1. Supervision of other normal pregnancy, antepartum - she has had BMZ x 2 - missed appt for 2 hour GTT - TDAP, Flu  vaccines - CBC, RPR, HIV today  Preterm labor symptoms and general obstetric precautions including but not limited to vaginal bleeding, contractions, leaking of fluid and fetal movement were reviewed in detail with the patient. Please refer to After Visit Summary for other counseling recommendations.  No follow-ups on file.  No future appointments.  Emily Filbert, MD

## 2018-05-27 NOTE — Progress Notes (Signed)
Pt reports contractions every 5 minutes all day for 1 week.

## 2018-05-28 LAB — HEMOGLOBIN A1C
ESTIMATED AVERAGE GLUCOSE: 108 mg/dL
Hgb A1c MFr Bld: 5.4 % (ref 4.8–5.6)

## 2018-05-28 LAB — RPR: RPR Ser Ql: NONREACTIVE

## 2018-05-28 LAB — HIV ANTIBODY (ROUTINE TESTING W REFLEX): HIV SCREEN 4TH GENERATION: NONREACTIVE

## 2018-06-05 ENCOUNTER — Encounter (HOSPITAL_COMMUNITY): Payer: Self-pay

## 2018-06-05 ENCOUNTER — Inpatient Hospital Stay (HOSPITAL_COMMUNITY)
Admission: AD | Admit: 2018-06-05 | Discharge: 2018-06-05 | Disposition: A | Discharge status: Home / Self Care | Payer: Self-pay | Source: Ambulatory Visit | Attending: Family Medicine | Admitting: Family Medicine

## 2018-06-05 ENCOUNTER — Other Ambulatory Visit: Payer: Self-pay

## 2018-06-05 DIAGNOSIS — D259 Leiomyoma of uterus, unspecified: Secondary | ICD-10-CM | POA: Insufficient documentation

## 2018-06-05 DIAGNOSIS — O26893 Other specified pregnancy related conditions, third trimester: Secondary | ICD-10-CM | POA: Insufficient documentation

## 2018-06-05 DIAGNOSIS — R102 Pelvic and perineal pain: Secondary | ICD-10-CM | POA: Insufficient documentation

## 2018-06-05 DIAGNOSIS — O3413 Maternal care for benign tumor of corpus uteri, third trimester: Secondary | ICD-10-CM | POA: Insufficient documentation

## 2018-06-05 DIAGNOSIS — O99820 Streptococcus B carrier state complicating pregnancy: Secondary | ICD-10-CM | POA: Insufficient documentation

## 2018-06-05 DIAGNOSIS — R109 Unspecified abdominal pain: Secondary | ICD-10-CM

## 2018-06-05 DIAGNOSIS — Z3A31 31 weeks gestation of pregnancy: Secondary | ICD-10-CM | POA: Insufficient documentation

## 2018-06-05 DIAGNOSIS — Z79899 Other long term (current) drug therapy: Secondary | ICD-10-CM | POA: Insufficient documentation

## 2018-06-05 LAB — URINALYSIS, ROUTINE W REFLEX MICROSCOPIC
BILIRUBIN URINE: NEGATIVE
Glucose, UA: NEGATIVE mg/dL
HGB URINE DIPSTICK: NEGATIVE
Ketones, ur: 80 mg/dL — AB
Leukocytes, UA: NEGATIVE
Nitrite: NEGATIVE
Protein, ur: NEGATIVE mg/dL
Specific Gravity, Urine: 1.017 (ref 1.005–1.030)
pH: 7 (ref 5.0–8.0)

## 2018-06-05 LAB — RAPID URINE DRUG SCREEN, HOSP PERFORMED
AMPHETAMINES: NOT DETECTED
Barbiturates: NOT DETECTED
Benzodiazepines: NOT DETECTED
Cocaine: NOT DETECTED
OPIATES: NOT DETECTED
Tetrahydrocannabinol: NOT DETECTED

## 2018-06-05 MED ORDER — CYCLOBENZAPRINE HCL 10 MG PO TABS
10.0000 mg | ORAL_TABLET | Freq: Once | ORAL | Status: AC
  Administered 2018-06-05: 10 mg via ORAL
  Filled 2018-06-05: qty 1

## 2018-06-05 MED ORDER — ONDANSETRON HCL 4 MG/2ML IJ SOLN
4.0000 mg | Freq: Once | INTRAMUSCULAR | Status: AC
  Administered 2018-06-05: 4 mg via INTRAVENOUS
  Filled 2018-06-05: qty 2

## 2018-06-05 MED ORDER — FENTANYL CITRATE (PF) 100 MCG/2ML IJ SOLN
50.0000 ug | Freq: Once | INTRAMUSCULAR | Status: AC
  Administered 2018-06-05: 50 ug via INTRAVENOUS
  Filled 2018-06-05: qty 2

## 2018-06-05 MED ORDER — HYDROCODONE-ACETAMINOPHEN 5-325 MG PO TABS: 2.0000 | ORAL_TABLET | Freq: Once | ORAL | Status: DC

## 2018-06-05 MED ORDER — LACTATED RINGERS IV BOLUS
1000.0000 mL | Freq: Once | INTRAVENOUS | Status: AC
  Administered 2018-06-05: 1000 mL via INTRAVENOUS

## 2018-06-05 NOTE — MAU Provider Note (Signed)
History     CSN: [id number]  Arrival date and time: 06/05/18 1047   First Provider Initiated Contact with Patient 06/05/18 1146      Chief Complaint  Patient presents with  . Abdominal Pain  . Emesis   Gabriela Morgan is a 37 y.o. [id number] at [redacted]w[redacted]d who presents today with abdominal pain since last night. This is a common problem for her, and she has had 9 MAU visits for this same complaint. She denies any VB or LOF. She reports normal fetal movement.   Pelvic Pain  The patient's primary symptoms include pelvic pain. The patient's pertinent negatives include no vaginal discharge. This is a new problem. The current episode started today. The problem occurs intermittently. The problem has been unchanged. Pain severity now: 10/10. The problem affects both sides. She is pregnant. Pertinent negatives include no chills, diarrhea, dysuria, fever, frequency or vomiting. The vaginal discharge was normal. There has been no bleeding. Nothing aggravates the symptoms. She has tried nothing for the symptoms. Sexual activity: denies intercourse in the last 24 hours     OB History    Gravida  4   Para  3   Term  1   Preterm  2   AB  0   Living  3     SAB  0   TAB  0   Ectopic  0   Multiple  0   Live Births  3        Obstetric Comments  2014 approx 28 weeks 2010 approx 30 weeks        Past Medical History:  Diagnosis Date  . Preterm labor   . Uterine fibroid     Past Surgical History:  Procedure Laterality Date  . APPENDECTOMY  2015   in [place]    Family History  Problem Relation Age of Onset  . Diabetes Mother   . Diabetes Brother     Social History   Tobacco Use  . Smoking status: Never Smoker  . Smokeless tobacco: Never Used  Substance Use Topics  . Alcohol use: Not Currently  . Drug use: Not Currently    Allergies: No Known Allergies  Medications Prior to Admission  Medication Sig Dispense Refill Last Dose  . promethazine (PHENERGAN) 25 MG  suppository Place 1 suppository (25 mg total) rectally every 6 (six) hours as needed for nausea or vomiting. USE RECTALLY AT BEDTIME 12 each 3 Past Week at Unknown time  . acetaminophen (TYLENOL) 325 MG tablet Take 650 mg by mouth every 6 (six) hours as needed for moderate pain.   Unknown at Unknown time  . glycopyrrolate (ROBINUL) 1 MG tablet Take 1 tablet (1 mg total) by mouth 3 (three) times daily. 90 tablet 0 Taking  . metoCLOPramide (REGLAN) 10 MG tablet Take 1 tablet (10 mg total) by mouth every 6 (six) hours as needed for nausea (nausea/headache). (Patient not taking: Reported on 05/27/2018) 6 tablet 0 Not Taking  . ondansetron (ZOFRAN ODT) 4 MG disintegrating tablet Take 1 tablet (4 mg total) by mouth every 8 (eight) hours as needed for nausea or vomiting. When phenergan or reglan don't work 20 tablet 0 Unknown at Unknown time  . Prenatal Vit-Fe Fumarate-FA (PRENATAL MULTIVITAMIN) TABS tablet Take 1 tablet by mouth daily at 12 noon.   Unknown at Unknown time  . progesterone (PROMETRIUM) 200 MG capsule Place one capsule vaginally at bedtime 30 capsule 2 Unknown at Unknown time  . promethazine (PHENERGAN) 25 MG tablet Take 1  tablet (25 mg total) by mouth every 6 (six) hours as needed. (Patient not taking: Reported on 05/12/2018) 30 tablet 1 Unknown at Unknown time  . ranitidine (ZANTAC) 150 MG tablet Take 1 tablet (150 mg total) by mouth 2 (two) times daily. 60 tablet 0 Unknown at Unknown time  . scopolamine (TRANSDERM-SCOP, 1.5 MG,) 1 MG/3DAYS Place 1 patch (1.5 mg total) onto the skin every 3 (three) days. (Patient not taking: Reported on 05/27/2018) 10 patch 12 Unknown at Unknown time    Review of Systems  Constitutional: Negative for chills and fever.  Gastrointestinal: Negative for diarrhea and vomiting.  Genitourinary: Positive for pelvic pain. Negative for dysuria, frequency, vaginal bleeding and vaginal discharge.   Physical Exam   Blood pressure 122/80, pulse 89, temperature 97.9 F  (36.6 C), resp. rate 20, height 5' 2.99" (1.6 m), weight 54.4 kg, last menstrual period 11/19/2017, SpO2 97 %, unknown if currently breastfeeding.  Physical Exam  Nursing note and vitals reviewed. Constitutional: She is oriented to person, place, and time. She appears well-developed and well-nourished. No distress.  HENT:  Head: Normocephalic.  Cardiovascular: Normal rate.  Respiratory: Effort normal.  GI: Soft. There is no tenderness. There is no rebound.  Genitourinary:  Genitourinary Comments: Cervix: 1/thick/-3   Neurological: She is alert and oriented to person, place, and time.  Skin: Skin is warm and dry.  Psychiatric: She has a normal mood and affect.   NST:  Baseline: 125 Variability: moderate Accels: 15x15 Decels: none Toco: none  Results for orders placed or performed during the hospital encounter of 06/05/18 (from the past 24 hour(s))  Urinalysis, Routine w reflex microscopic     Status: Abnormal   Collection Time: 06/05/18 11:08 AM  Result Value Ref Range   Color, Urine YELLOW YELLOW   APPearance CLOUDY (A) CLEAR   Specific Gravity, Urine 1.017 1.005 - 1.030   pH 7.0 5.0 - 8.0   Glucose, UA NEGATIVE NEGATIVE mg/dL   Hgb urine dipstick NEGATIVE NEGATIVE   Bilirubin Urine NEGATIVE NEGATIVE   Ketones, ur 80 (A) NEGATIVE mg/dL   Protein, ur NEGATIVE NEGATIVE mg/dL   Nitrite NEGATIVE NEGATIVE   Leukocytes, UA NEGATIVE NEGATIVE  Urine rapid drug screen (hosp performed)     Status: None   Collection Time: 06/05/18 11:08 AM  Result Value Ref Range   Opiates NONE DETECTED NONE DETECTED   Cocaine NONE DETECTED NONE DETECTED   Benzodiazepines NONE DETECTED NONE DETECTED   Amphetamines NONE DETECTED NONE DETECTED   Tetrahydrocannabinol NONE DETECTED NONE DETECTED   Barbiturates NONE DETECTED NONE DETECTED    MAU Course  Procedures  MDM 2:40 PM no cervical change. Patient reprots that her pain is the same at this time.   Assessment and Plan   1. Abdominal  pain during pregnancy in third trimester   2. Group B Streptococcus carrier, antepartum   3. Uterine leiomyoma, unspecified location   4. [redacted] weeks gestation of pregnancy    DC home Comfort measures reviewed  3rd Trimester precautions  PTL precautions  Fetal kick counts RX: none  Return to MAU as needed FU with OB as planned  [place] for [facility] Follow up.   Specialty:  Obstetrics and Gynecology Contact information: [address] 06/05/2018, 2:40 PM

## 2018-06-05 NOTE — Discharge Instructions (Signed)
Dolor abdominal durante el embarazo (Abdominal Pain During Pregnancy) El dolor de vientre (abdominal) es habitual durante el embarazo. Generalmente no se trata de un problema grave. Otras veces puede ser un signo de que algo no anda bien. Siempre comunquese con su mdico si tiene dolor abdominal. CUIDADOS EN EL HOGAR Controle el dolor para ver si hay cambios. Las indicaciones que siguen pueden ayudarla a sentirse mejor:  Optician, dispensing (relaciones sexuales) ni se coloque nada dentro de la vagina hasta que se sienta mejor.  Haga reposo hasta que el dolor se calme.  Si siente ganas de vomitar (nuseas ) beba lquidos claros. No consuma alimentos slidos hasta que se sienta mejor.  Slo tome los medicamentos que le haya indicado su mdico.  Cumpla con las visitas al mdico segn las indicaciones. SOLICITE AYUDA DE INMEDIATO SI:  Tiene un sangrado, pierde lquido o elimina trozos de tejido por la vagina.  Siente ms dolor o clicos.  Comienza a vomitar.  Siente dolor al orinar u observa sangre en la orina.  Tiene fiebre.  No siente que el beb se mueva mucho.  Se siente muy dbil o cree que va a desmayarse.  Tiene dificultad para respirar con o sin dolor en el vientre.  Siente un dolor de cabeza muy intenso y Social research officer, government en el vientre.  Observa que sale un lquido por la vagina y tiene dolor abdominal.  La materia fecal es lquida (diarrea).  El dolor en el viente no desaparece, o empeora, luego de hacer reposo. ASEGRESE DE QUE:  Comprende estas instrucciones.  Controlar su afeccin.  Recibir ayuda de inmediato si no mejora o si empeora. Esta informacin no tiene Marine scientist el consejo del mdico. Asegrese de hacerle al mdico cualquier pregunta que tenga. Document Released: 05/20/2011 Document Revised: 12/30/2015 Document Reviewed: 04/06/2013 Elsevier Interactive Patient Education  [employer].

## 2018-06-05 NOTE — MAU Note (Signed)
Pt states she's been having pain since last night, has not taken anything. Pain is 10/10. Pt has known fibroids. No bleeding or LOF, +FM

## 2018-06-07 ENCOUNTER — Ambulatory Visit (HOSPITAL_COMMUNITY): Admission: RE | Admit: 2018-06-07 | Payer: Self-pay | Source: Ambulatory Visit

## 2018-06-08 ENCOUNTER — Ambulatory Visit (HOSPITAL_COMMUNITY): Admission: RE | Admit: 2018-06-08 | Payer: Self-pay | Source: Ambulatory Visit

## 2018-06-09 ENCOUNTER — Inpatient Hospital Stay (HOSPITAL_COMMUNITY)
Admission: AD | Admit: 2018-06-09 | Discharge: 2018-06-10 | Disposition: A | Discharge status: Home / Self Care | Payer: Self-pay | Source: Ambulatory Visit | Attending: Obstetrics & Gynecology | Admitting: Obstetrics & Gynecology

## 2018-06-09 ENCOUNTER — Encounter (HOSPITAL_COMMUNITY): Payer: Self-pay | Admitting: *Deleted

## 2018-06-09 DIAGNOSIS — O4703 False labor before 37 completed weeks of gestation, third trimester: Secondary | ICD-10-CM

## 2018-06-09 DIAGNOSIS — O210 Mild hyperemesis gravidarum: Secondary | ICD-10-CM | POA: Insufficient documentation

## 2018-06-09 DIAGNOSIS — O26893 Other specified pregnancy related conditions, third trimester: Secondary | ICD-10-CM | POA: Insufficient documentation

## 2018-06-09 DIAGNOSIS — O09212 Supervision of pregnancy with history of pre-term labor, second trimester: Secondary | ICD-10-CM

## 2018-06-09 DIAGNOSIS — O3412 Maternal care for benign tumor of corpus uteri, second trimester: Secondary | ICD-10-CM

## 2018-06-09 DIAGNOSIS — Z3A32 32 weeks gestation of pregnancy: Secondary | ICD-10-CM | POA: Insufficient documentation

## 2018-06-09 DIAGNOSIS — D259 Leiomyoma of uterus, unspecified: Secondary | ICD-10-CM

## 2018-06-09 DIAGNOSIS — O09892 Supervision of other high risk pregnancies, second trimester: Secondary | ICD-10-CM

## 2018-06-09 LAB — COMPREHENSIVE METABOLIC PANEL
ALBUMIN: 2.9 g/dL — AB (ref 3.5–5.0)
ALT: 11 U/L (ref 0–44)
ANION GAP: 12 (ref 5–15)
AST: 11 U/L — ABNORMAL LOW (ref 15–41)
Alkaline Phosphatase: 95 U/L (ref 38–126)
BUN: 5 mg/dL — ABNORMAL LOW (ref 6–20)
CO2: 21 mmol/L — ABNORMAL LOW (ref 22–32)
Calcium: 8.4 mg/dL — ABNORMAL LOW (ref 8.9–10.3)
Chloride: 99 mmol/L (ref 98–111)
Creatinine, Ser: 0.34 mg/dL — ABNORMAL LOW (ref 0.44–1.00)
GFR calc non Af Amer: >60 mL/min (ref >60)
Glucose, Bld: 80 mg/dL (ref 70–99)
POTASSIUM: 3.8 mmol/L (ref 3.5–5.1)
SODIUM: 132 mmol/L — AB (ref 135–145)
TOTAL PROTEIN: 6 g/dL — AB (ref 6.5–8.1)
Total Bilirubin: 0.8 mg/dL (ref 0.3–1.2)

## 2018-06-09 LAB — CBC
HCT: 29.7 % — ABNORMAL LOW (ref 36.0–46.0)
Hemoglobin: 9.8 g/dL — ABNORMAL LOW (ref 12.0–15.0)
MCH: 27.4 pg (ref 26.0–34.0)
MCHC: 33 g/dL (ref 30.0–36.0)
MCV: 83 fL (ref 78.0–100.0)
Platelets: 267 10*3/uL (ref 150–400)
RBC: 3.58 MIL/uL — ABNORMAL LOW (ref 3.87–5.11)
RDW: 13.4 % (ref 11.5–15.5)
WBC: 9.2 10*3/uL (ref 4.0–10.5)

## 2018-06-09 LAB — URINALYSIS, ROUTINE W REFLEX MICROSCOPIC
BILIRUBIN URINE: NEGATIVE
Glucose, UA: NEGATIVE mg/dL
Hgb urine dipstick: NEGATIVE
Ketones, ur: 80 mg/dL — AB
NITRITE: NEGATIVE
PROTEIN: 30 mg/dL — AB
Specific Gravity, Urine: 1.023 (ref 1.005–1.030)
pH: 6 (ref 5.0–8.0)

## 2018-06-09 LAB — FETAL FIBRONECTIN: Fetal Fibronectin: NEGATIVE

## 2018-06-09 MED ORDER — OXYCODONE-ACETAMINOPHEN 5-325 MG PO TABS
1.0000 | ORAL_TABLET | Freq: Once | ORAL | Status: DC
  Filled 2018-06-09: qty 1

## 2018-06-09 MED ORDER — NIFEDIPINE 10 MG PO CAPS
10.0000 mg | ORAL_CAPSULE | ORAL | Status: DC | PRN
  Administered 2018-06-09: 10 mg via ORAL
  Filled 2018-06-09: qty 1

## 2018-06-09 MED ORDER — ONDANSETRON HCL 4 MG/2ML IJ SOLN
4.0000 mg | Freq: Four times a day (QID) | INTRAMUSCULAR | Status: DC | PRN
  Administered 2018-06-09: 4 mg via INTRAVENOUS
  Filled 2018-06-09: qty 2

## 2018-06-09 MED ORDER — HYOSCYAMINE SULFATE 0.125 MG SL SUBL
0.1250 mg | SUBLINGUAL_TABLET | Freq: Once | SUBLINGUAL | Status: AC
  Administered 2018-06-09: 0.125 mg via SUBLINGUAL
  Filled 2018-06-09: qty 1

## 2018-06-09 MED ORDER — LACTATED RINGERS IV SOLN
INTRAVENOUS | Status: DC
  Administered 2018-06-09 – 2018-06-10 (×3): via INTRAVENOUS

## 2018-06-09 NOTE — MAU Provider Note (Signed)
Chief Complaint:  Abdominal Pain   First Provider Initiated Contact with Patient 06/09/18 2048     HPI: Gabriela Morgan is a 37 y.o. [id number] at 81w0dwho presents to maternity admissions reporting abdominal pain that comes and goes.  Has had this for several weeks now, with 10 visits to MAU for this.  Has had hyperemesis also and has several meds at home but does not take them.  Tells me she did use her prometrium 2 days ago.  Told other RN she did not use it.  Has a history of preterm birth.  Has a known large myoma anterior uterus, about 6cm She reports good fetal movement, denies LOF, vaginal bleeding, vaginal itching/burning, urinary symptoms, h/a, dizziness, n/v, diarrhea, constipation or fever/chills.  She denies headache, visual changes or RUQ abdominal pain.  Abdominal Pain  This is a recurrent problem. The current episode started 1 to 4 weeks ago. The onset quality is gradual. The problem occurs intermittently. The problem has been unchanged. The pain is located in the generalized abdominal region, periumbilical region and suprapubic region. The quality of the pain is cramping and colicky. The abdominal pain does not radiate. Pertinent negatives include no constipation, diarrhea, dysuria, fever, frequency, headaches, myalgias, nausea or vomiting. Nothing aggravates the pain. The pain is relieved by nothing. She has tried acetaminophen for the symptoms. The treatment provided no relief.   RN note: PT SAYS WITH INTERPRETER- VIREA-    WAS HERE ON  Sunday- FOR SAME PAIN.    HAS BACK  AND ABD PAIN-     MON - WED - NO PAIN,   THEN   TODAY  AT 0500 - STARTED  SAME PAIN.  TOOK  TYLENOL 1000 MG - AT  4PM-  , VOMITED.       DX Sunday -  HAS CYST, DEHYDRATED   Past Medical History: Past Medical History:  Diagnosis Date  . Preterm labor   . Uterine fibroid     Past obstetric history: OB History  Gravida Para Term Preterm AB Living  4 3 1 2  0 3  SAB TAB Ectopic Multiple Live Births  0 0 0 0 3     # Outcome Date GA Lbr Len/2nd Weight Sex Delivery Anes PTL Lv  4 Current           3 Preterm 2014   1361 g M Vag-Spont   LIV  2 Preterm 2010   2041 g F Vag-Spont   LIV  1 Term 2005   3402 g M Vag-Spont   LIV    Obstetric Comments  2014 approx 28 weeks  2010 approx 30 weeks    Past Surgical History: Past Surgical History:  Procedure Laterality Date  . APPENDECTOMY  2015   in [place]    Family History: Family History  Problem Relation Age of Onset  . Diabetes Mother   . Diabetes Brother     Social History: Social History   Tobacco Use  . Smoking status: Never Smoker  . Smokeless tobacco: Never Used  Substance Use Topics  . Alcohol use: Not Currently  . Drug use: Not Currently    Allergies: No Known Allergies  Meds:  Medications Prior to Admission  Medication Sig Dispense Refill Last Dose  . acetaminophen (TYLENOL) 325 MG tablet Take 650 mg by mouth every 6 (six) hours as needed for moderate pain.   06/09/2018 at Unknown time  . ondansetron (ZOFRAN ODT) 4 MG disintegrating tablet Take 1 tablet (4 mg total) by  mouth every 8 (eight) hours as needed for nausea or vomiting. When phenergan or reglan don't work 20 tablet 0 06/09/2018 at Unknown time  . progesterone (PROMETRIUM) 200 MG capsule Place one capsule vaginally at bedtime 30 capsule 2 Past Week at Unknown time  . glycopyrrolate (ROBINUL) 1 MG tablet Take 1 tablet (1 mg total) by mouth 3 (three) times daily. 90 tablet 0 Unknown at Unknown time  . metoCLOPramide (REGLAN) 10 MG tablet Take 1 tablet (10 mg total) by mouth every 6 (six) hours as needed for nausea (nausea/headache). (Patient not taking: Reported on 05/27/2018) 6 tablet 0 Unknown at Unknown time  . Prenatal Vit-Fe Fumarate-FA (PRENATAL MULTIVITAMIN) TABS tablet Take 1 tablet by mouth daily at 12 noon.   Unknown at Unknown time  . promethazine (PHENERGAN) 25 MG suppository Place 1 suppository (25 mg total) rectally every 6 (six) hours as needed for nausea or  vomiting. USE RECTALLY AT BEDTIME 12 each 3 Unknown at Unknown time  . promethazine (PHENERGAN) 25 MG tablet Take 1 tablet (25 mg total) by mouth every 6 (six) hours as needed. (Patient not taking: Reported on 05/12/2018) 30 tablet 1 Unknown at Unknown time  . ranitidine (ZANTAC) 150 MG tablet Take 1 tablet (150 mg total) by mouth 2 (two) times daily. 60 tablet 0 Unknown at Unknown time  . scopolamine (TRANSDERM-SCOP, 1.5 MG,) 1 MG/3DAYS Place 1 patch (1.5 mg total) onto the skin every 3 (three) days. (Patient not taking: Reported on 05/27/2018) 10 patch 12 Unknown at Unknown time    I have reviewed patient's Past Medical Hx, Surgical Hx, Family Hx, Social Hx, medications and allergies.   ROS:  Review of Systems  Constitutional: Negative for fever.  Gastrointestinal: Positive for abdominal pain. Negative for constipation, diarrhea, nausea and vomiting.  Genitourinary: Negative for dysuria and frequency.  Musculoskeletal: Negative for myalgias.  Neurological: Negative for headaches.   Other systems negative  Physical Exam   Patient Vitals for the past 24 hrs:  BP Temp Temp src Pulse Resp Weight  06/09/18 2342 114/76 - - - - -  06/09/18 2236 100/63 - - 83 - -  06/09/18 2001 109/63 98.1 F (36.7 C) Oral 98 18 53.2 kg   Constitutional: Well-developed, well-nourished female in no acute distress.  Cardiovascular: normal rate and rhythm Respiratory: normal effort, clear to auscultation bilaterally GI: Abd soft, non-tender, gravid appropriate for gestational age.   No rebound or guarding. MS: Extremities nontender, no edema, normal ROM Neurologic: Alert and oriented x 4.  GU: Neg CVAT.  PELVIC EXAM: Cervix pink, visually closed, without lesion, scant white creamy discharge, vaginal walls and external genitalia normal Dilation: 1.5 Effacement (%): 60 Cervical Position: Middle Station: -3 Presentation: Vertex Exam by:: Jimmye Norman CNM  FHT:  Baseline 140 , moderate variability,  accelerations present, no decelerations Contractions: q 3-10 min with irritability between, irregular   Labs: Results for orders placed or performed during the hospital encounter of 06/09/18 (from the past 24 hour(s))  Urinalysis, Routine w reflex microscopic     Status: Abnormal   Collection Time: 06/09/18  8:09 PM  Result Value Ref Range   Color, Urine YELLOW YELLOW   APPearance CLEAR CLEAR   Specific Gravity, Urine 1.023 1.005 - 1.030   pH 6.0 5.0 - 8.0   Glucose, UA NEGATIVE NEGATIVE mg/dL   Hgb urine dipstick NEGATIVE NEGATIVE   Bilirubin Urine NEGATIVE NEGATIVE   Ketones, ur 80 (A) NEGATIVE mg/dL   Protein, ur 30 (A) NEGATIVE mg/dL  Nitrite NEGATIVE NEGATIVE   Leukocytes, UA TRACE (A) NEGATIVE   RBC / HPF 0-5 0 - 5 RBC/hpf   WBC, UA 0-5 0 - 5 WBC/hpf   Bacteria, UA RARE (A) NONE SEEN   Squamous Epithelial / LPF 0-5 0 - 5   Mucus PRESENT   Fetal fibronectin     Status: None   Collection Time: 06/09/18  8:59 PM  Result Value Ref Range   Fetal Fibronectin NEGATIVE NEGATIVE  CBC     Status: Abnormal   Collection Time: 06/09/18 10:49 PM  Result Value Ref Range   WBC 9.2 4.0 - 10.5 K/uL   RBC 3.58 (L) 3.87 - 5.11 MIL/uL   Hemoglobin 9.8 (L) 12.0 - 15.0 g/dL   HCT 29.7 (L) 36.0 - 46.0 %   MCV 83.0 78.0 - 100.0 fL   MCH 27.4 26.0 - 34.0 pg   MCHC 33.0 30.0 - 36.0 g/dL   RDW 13.4 11.5 - 15.5 %   Platelets 267 150 - 400 K/uL  Comprehensive metabolic panel     Status: Abnormal   Collection Time: 06/09/18 10:49 PM  Result Value Ref Range   Sodium 132 (L) 135 - 145 mmol/L   Potassium 3.8 3.5 - 5.1 mmol/L   Chloride 99 98 - 111 mmol/L   CO2 21 (L) 22 - 32 mmol/L   Glucose, Bld 80 70 - 99 mg/dL   BUN 5 (L) 6 - 20 mg/dL   Creatinine, Ser 0.34 (L) 0.44 - 1.00 mg/dL   Calcium 8.4 (L) 8.9 - 10.3 mg/dL   Total Protein 6.0 (L) 6.5 - 8.1 g/dL   Albumin 2.9 (L) 3.5 - 5.0 g/dL   AST 11 (L) 15 - 41 U/L   ALT 11 0 - 44 U/L   Alkaline Phosphatase 95 38 - 126 U/L   Total Bilirubin  0.8 0.3 - 1.2 mg/dL   GFR calc non Af Amer >60 >60 mL/min   GFR calc Af Amer >60 >60 mL/min   Anion gap 12 5 - 15   --/--/B POS, B POS Performed at [facility], [address] [place], [place] [address]  (08/22 1357)  Imaging:    MAU Course/MDM: I have ordered labs and reviewed results.  NST reviewed and is reactive Treatments in MAU included IV hydration, Zofran, Levsin, with not much relief.  Gave one dose of Procardia but pt refused other doses due to nausea.  UCs only irritability.  Rechecked cervix and it is unchanged.   At times, she appears very calm with no apparent discomfort, then at other times reports constant pain that is no better.  Tells me her pain is almost gone then tells RN it is a "7".  Decision made to use Terbutaline since pt won't try Procardia, to knock out irritability, since pt continues to moan in pain. This stopped contractions and pt feels much better.  Phenergan given IV with good relief of nausea.  Recheck of cervix was unchanged. Discharged home with instructions to use any or all of the three meds for nausea.  Try to push fluiids and keep bladder empty.  Pt was reluctant to get up to void while here, so wonder if some irritability is related to chronically full bladder.   Has had Betamethasone series.  Evelina Bucy was used with all interactions.    Assessment: Intrauterine pregnancy at [redacted]w[redacted]d Uterine irritability without cervical change Hyperemesis  Plan: Discharge home Labor precautions and fetal kick counts Follow up in Office for  prenatal visits and recheck of status  Encouraged to return here or to other Urgent Care/ED if she develops worsening of symptoms, increase in pain, fever, or other concerning symptoms.   Pt stable at time of discharge.  Hansel Feinstein CNM, MSN Certified Nurse-Midwife 06/10/2018 1:40 AM

## 2018-06-09 NOTE — MAU Note (Signed)
PT SAYS WITH INTERPRETER- VIREA-    WAS HERE ON  Sunday- FOR SAME PAIN.    HAS BACK  AND ABD PAIN-     MON - WED - NO PAIN,   THEN   TODAY  AT 0500 - STARTED  SAME PAIN.  TOOK  TYLENOL 1000 MG - AT  4PM-  , VOMITED.       DX Sunday -  HAS CYST, DEHYDRATED.

## 2018-06-10 MED ORDER — TERBUTALINE SULFATE 1 MG/ML IJ SOLN
0.2500 mg | Freq: Once | INTRAMUSCULAR | Status: AC
  Administered 2018-06-10: 0.25 mg via SUBCUTANEOUS
  Filled 2018-06-10: qty 1

## 2018-06-10 MED ORDER — TRAMADOL HCL 50 MG PO TABS
50.0000 mg | ORAL_TABLET | Freq: Once | ORAL | Status: AC
  Administered 2018-06-10: 50 mg via ORAL
  Filled 2018-06-10: qty 1

## 2018-06-10 MED ORDER — PROMETHAZINE HCL 25 MG/ML IJ SOLN
12.5000 mg | Freq: Once | INTRAMUSCULAR | Status: AC
  Administered 2018-06-10: 12.5 mg via INTRAVENOUS
  Filled 2018-06-10: qty 1

## 2018-06-10 NOTE — Discharge Instructions (Signed)
Informacin sobre parto y [place] de parto prematuros Preterm Labor and Birth Information El embarazo tiene generalmente una duracin de 39 a 41 semanas. El [place] de parto es prematuro cuando se inicia muy pronto. Comienza antes de completar las 37 semanas de [place]. Cules son los factores de riesgo del [place] de [place] prematuro? Existen mayores probabilidades de trabajo de parto prematuro en mujeres con las siguientes caractersticas:  Tuvieron una infeccin Solicitor.  El cuello uterino es corto.  Tuvieron trabajo de parto prematuro anteriormente.  Se sometieron a una ciruga en el cuello uterino.  Son menores de 17aos.  Tienen ms de 35aos.  Son afroamericanas.  Estn embarazadas de dos o ms bebs.  Consumen drogas mientras estn embarazadas.  Fuman mientras estn embarazadas.  No aumentan de peso lo suficiente durante el [employer].  Se embarazaron inmediatamente despus de Psychologist, clinical.  Cules son los sntomas del [place] prematuro? Los sntomas del trabajo de parto prematuro incluyen lo siguiente:  Marketing executive. Los calambres pueden parecerse a los que tiene una mujer durante el perodo menstrual. Los calambres pueden presentarse con diarrea.  Dolor de vientre (abdomen).  Dolor en la zona lumbar.  Tiene contracciones regulares o endurecimiento del tero. Siente como si el vientre se endurece.  Presin en la zona inferior del vientre que Futures trader.  Pierde ms lquido (secrecin) por la vagina. El lquido puede ser acuoso o con [place].  Ruptura de la bolsa de aguas.  Por qu es importante notar los signos del [place] de [place] prematuro? Los bebs que nacen antes de tiempo pueden no estar completamente desarrollados. Estos pueden tener un riesgo mayor de padecer:  Problemas cardacos a Barrister's clerk.  Problemas pulmonares a Barrister's clerk.  Dificultades para controlar los sistemas corporales, por ejemplo, respirar.  Hemorragia  cerebral.  Una afeccin que se denomina parlisis cerebral.  Dificultades en el aprendizaje.  Muerte.  Estos riesgos son [employer] para bebs que nacen antes de las 34semanas de [place]. Cmo se trata Leander Rams de parto prematuro? El tratamiento depende de lo siguiente:  El tiempo de [place].  Su estado de [place].  La salud del beb.  El tratamiento puede incluir lo siguiente:  Un punto (sutura) en el cuello uterino. Al parir, el cuello uterino se abre para que el beb pueda salir. El punto impide que el cuello uterino se abra antes de [place].  Permanecer en el hospital.  Tomar medicamentos como, por ejemplo: ? Medicamentos hormonales. ? Medicamentos para Scientist, water quality las contracciones. ? Medicamentos para ayudar a la maduracin de los pulmones del beb. ? Medicamentos para evitar que el beb desarrolle parlisis cerebral.  Qu debo hacer si estoy en [place] Plan prematuro? Si cree que est en trabajo de parto demasiado pronto, llame a su mdico de inmediato. Cmo puedo prevenir el trabajo de parto prematuro?  No use productos que contengan tabaco. ? Estos incluyen cigarrillos, tabaco para Higher education careers adviser y Psychologist, sport and exercise. ? Si necesita ayuda para dejar de fumar, consulte al mdico.  No consuma drogas.  No tome ningn medicamento si el mdico no se lo indic.  Consulte al mdico antes de empezar a tomar cualquier suplemento de hierbas.  Asegrese aumentar de peso como corresponde.  Tenga cuidado con las infecciones. Si cree que puede tener una infeccin, consulte al mdico para que la revisen inmediatamente.  Infrmele al mdico si ha tenido trabajo de parto prematuro anteriormente. Esta informacin no tiene Marine scientist el consejo del mdico. Asegrese de hacerle al mdico  cualquier pregunta que tenga. Document Released: 10/10/2010 Document Revised: 12/16/2016 Document Reviewed: 01/29/2016 Elsevier Interactive Patient Education  [employer].

## 2018-06-14 ENCOUNTER — Ambulatory Visit (HOSPITAL_COMMUNITY): Payer: Self-pay

## 2018-06-16 ENCOUNTER — Ambulatory Visit (INDEPENDENT_AMBULATORY_CARE_PROVIDER_SITE_OTHER): Payer: Self-pay | Admitting: Obstetrics & Gynecology

## 2018-06-16 ENCOUNTER — Inpatient Hospital Stay (HOSPITAL_COMMUNITY)
Admission: AD | Admit: 2018-06-16 | Discharge: 2018-06-16 | Disposition: A | Discharge status: Home / Self Care | Payer: Self-pay | Source: Ambulatory Visit | Attending: Obstetrics and Gynecology | Admitting: Obstetrics and Gynecology

## 2018-06-16 ENCOUNTER — Encounter: Payer: Self-pay | Admitting: Obstetrics & Gynecology

## 2018-06-16 VITALS — BP 130/86 | HR 98 | Wt 116.3 lb

## 2018-06-16 DIAGNOSIS — O09212 Supervision of pregnancy with history of pre-term labor, second trimester: Secondary | ICD-10-CM

## 2018-06-16 DIAGNOSIS — Z3480 Encounter for supervision of other normal pregnancy, unspecified trimester: Secondary | ICD-10-CM

## 2018-06-16 DIAGNOSIS — O26899 Other specified pregnancy related conditions, unspecified trimester: Secondary | ICD-10-CM

## 2018-06-16 DIAGNOSIS — O26893 Other specified pregnancy related conditions, third trimester: Secondary | ICD-10-CM

## 2018-06-16 DIAGNOSIS — O210 Mild hyperemesis gravidarum: Secondary | ICD-10-CM

## 2018-06-16 DIAGNOSIS — D259 Leiomyoma of uterus, unspecified: Secondary | ICD-10-CM | POA: Insufficient documentation

## 2018-06-16 DIAGNOSIS — O3413 Maternal care for benign tumor of corpus uteri, third trimester: Secondary | ICD-10-CM | POA: Insufficient documentation

## 2018-06-16 DIAGNOSIS — O09892 Supervision of other high risk pregnancies, second trimester: Secondary | ICD-10-CM

## 2018-06-16 DIAGNOSIS — R109 Unspecified abdominal pain: Secondary | ICD-10-CM | POA: Insufficient documentation

## 2018-06-16 DIAGNOSIS — Z3A32 32 weeks gestation of pregnancy: Secondary | ICD-10-CM | POA: Insufficient documentation

## 2018-06-16 LAB — URINALYSIS, ROUTINE W REFLEX MICROSCOPIC
BILIRUBIN URINE: NEGATIVE
Glucose, UA: NEGATIVE mg/dL
Hgb urine dipstick: NEGATIVE
Ketones, ur: 80 mg/dL — AB
LEUKOCYTES UA: NEGATIVE
NITRITE: NEGATIVE
PROTEIN: 30 mg/dL — AB
Specific Gravity, Urine: 1.024 (ref 1.005–1.030)
pH: 7 (ref 5.0–8.0)

## 2018-06-16 MED ORDER — NIFEDIPINE 10 MG PO CAPS: 10.0000 mg | ORAL_CAPSULE | Freq: Four times a day (QID) | ORAL | 0 refills | Status: DC | PRN

## 2018-06-16 NOTE — Progress Notes (Signed)
   PRENATAL VISIT NOTE  Subjective:  Gabriela Morgan is a 37 y.o. [phone] at [redacted]w[redacted]d being seen today for ongoing prenatal care.  She is currently monitored for the following issues for this high-risk pregnancy and has Hyperemesis affecting pregnancy, antepartum; Supervision of other normal pregnancy, antepartum; Limited prenatal care in second trimester; Uterine fibroids affecting pregnancy in second trimester; Language barrier; Hx of preterm delivery, currently pregnant, second trimester; AMA (advanced maternal age) multigravida 32+, second trimester; Abdominal pain in pregnancy, second trimester; Dehydration; and Group B Streptococcus carrier, antepartum on their problem list.  Patient reports contractions since 12minutes.  Contractions: Irregular. Vag. Bleeding: None.  Movement: Present. Denies leaking of fluid.   The following portions of the patient's history were reviewed and updated as appropriate: allergies, current medications, past family history, past medical history, past social history, past surgical history and problem list. Problem list updated.  Objective:   Vitals:   06/16/18 1001  BP: 130/86  Pulse: 98  Weight: 116 lb 4.8 oz (52.8 kg)    Fetal Status: Fetal Heart Rate (bpm): 155   Movement: Present     General:  Alert, oriented and cooperative. Patient is in no acute distress.  Skin: Skin is warm and dry. No rash noted.   Cardiovascular: Normal heart rate noted  Respiratory: Normal respiratory effort, no problems with respiration noted  Abdomen: Soft, gravid, appropriate for gestational age.  Pain/Pressure: Present     Pelvic: Cervical exam performed        Extremities: Normal range of motion.     Mental Status: Normal mood and affect. Normal behavior. Normal judgment and thought content.   Assessment and Plan:  Pregnancy: [id number] at [redacted]w[redacted]d  1. Supervision of other normal pregnancy, antepartum Very uncomfortable with contractions, continued N&V  2. Hyperemesis  affecting pregnancy, antepartum On medication   3. Hx of preterm delivery, currently pregnant, second trimester Sx of PTL, to MAU for further evaluation  Preterm labor symptoms and general obstetric precautions including but not limited to vaginal bleeding, contractions, leaking of fluid and fetal movement were reviewed in detail with the patient. Please refer to After Visit Summary for other counseling recommendations.  Return in about 1 week (around 06/23/2018).  No future appointments.  Emeterio Reeve, MD

## 2018-06-16 NOTE — MAU Provider Note (Signed)
Chief Complaint:  Contractions   First Provider Initiated Contact with Patient 06/16/18 1149     HPI: Gabriela Morgan is a 37 y.o. [id number] at [redacted]w[redacted]d who presents to maternity admissions reporting abdominal pain. Has been seen in MAU multiple times with same complaint since [redacted] weeks gestation. Has large uterine fibroid.  Current symptoms started about 30 minutes prior to arriving for her ob visit this morning. States ctx were every 5 minutes but has spaced out since being brought to MAU. Had negative FFN last week, SVE 1.5 cm. This morning her cervix was 2.5/50/-3 in the office per Dr. Roselie Awkward.  Denies LOF or vaginal bleeding. Positive fetal movement.  Has vomited 5x per day, being tx for HEG.   Location: abdomen Quality: cramping Severity: 9/10 in pain scale Duration: 3 hours Timing: intermittent Modifying factors: none Associated signs and symptoms: none   Past Medical History:  Diagnosis Date  . Preterm labor   . Uterine fibroid    OB History  Gravida Para Term Preterm AB Living  4 3 1 2  0 3  SAB TAB Ectopic Multiple Live Births  0 0 0 0 3    # Outcome Date GA Lbr Len/2nd Weight Sex Delivery Anes PTL Lv  4 Current           3 Preterm 2014   1361 g M Vag-Spont   LIV  2 Preterm 2010   2041 g F Vag-Spont   LIV  1 Term 2005   3402 g M Vag-Spont   LIV    Obstetric Comments  2014 approx 28 weeks  2010 approx 30 weeks   Past Surgical History:  Procedure Laterality Date  . APPENDECTOMY  2015   in [place]   Family History  Problem Relation Age of Onset  . Diabetes Mother   . Diabetes Brother    Social History   Tobacco Use  . Smoking status: Never Smoker  . Smokeless tobacco: Never Used  Substance Use Topics  . Alcohol use: Not Currently  . Drug use: Not Currently   No Known Allergies No medications prior to admission.    I have reviewed patient's Past Medical Hx, Surgical Hx, Family Hx, Social Hx, medications and allergies.   ROS:  Review of Systems   Constitutional: Negative.   Gastrointestinal: Positive for abdominal pain, nausea and vomiting.  Genitourinary: Negative.     Physical Exam   Patient Vitals for the past 24 hrs:  BP Temp Pulse  06/16/18 1235 103/74 - 89  06/16/18 1107 108/69 98.3 F (36.8 C) 94    Constitutional: Well-developed, well-nourished female in no acute distress.  Cardiovascular: normal rate & rhythm, no murmur Respiratory: normal effort, lung sounds clear throughout GI: Abd soft, non-tender, gravid appropriate for gestational age. Pos BS x 4 MS: Extremities nontender, no edema, normal ROM Neurologic: Alert and oriented x 4.  GU:        Dilation: 2.5 Effacement (%): 50 Station: -3 Presentation: Vertex Exam by:: CNM  NST:  Baseline: 145 bpm, Variability: Good {> 6 bpm), Accelerations: Reactive and Decelerations: Absent   Labs: Results for orders placed or performed during the hospital encounter of 06/16/18 (from the past 24 hour(s))  Urinalysis, Routine w reflex microscopic     Status: Abnormal   Collection Time: 06/16/18 12:15 PM  Result Value Ref Range   Color, Urine AMBER (A) YELLOW   APPearance CLEAR CLEAR   Specific Gravity, Urine 1.024 1.005 - 1.030   pH 7.0 5.0 - 8.0  Glucose, UA NEGATIVE NEGATIVE mg/dL   Hgb urine dipstick NEGATIVE NEGATIVE   Bilirubin Urine NEGATIVE NEGATIVE   Ketones, ur 80 (A) NEGATIVE mg/dL   Protein, ur 30 (A) NEGATIVE mg/dL   Nitrite NEGATIVE NEGATIVE   Leukocytes, UA NEGATIVE NEGATIVE   RBC / HPF 0-5 0 - 5 RBC/hpf   WBC, UA 0-5 0 - 5 WBC/hpf   Bacteria, UA RARE (A) NONE SEEN   Squamous Epithelial / LPF 0-5 0 - 5   Mucus PRESENT     Imaging:  No results found.  MAU Course: Orders Placed This Encounter  Procedures  . Urinalysis, Routine w reflex microscopic  . Discharge patient   Meds ordered this encounter  Medications  . NIFEdipine (PROCARDIA) 10 MG capsule    Sig: Take 1 capsule (10 mg total) by mouth every 6 (six) hours as needed.     Dispense:  30 capsule    Refill:  0    Order Specific Question:   Supervising Provider    Answer:   Aletha Halim [[id number]]    MDM: Some UI, no ctx traced. Abdomen soft, no ctx palpated. Cervical exam unchanged after 2+ hours of monitoring. Pt reports improvement in symptoms.  C/w Dr. Ilda Basset. Ok to discharge home w/RX procardia prn Bedside Spanish interpreter used for visit  Assessment: 1. Abdominal pain affecting pregnancy   2. Uterine fibroids affecting pregnancy in third trimester   3. [redacted] weeks gestation of pregnancy     Plan: Discharge home in stable condition.  Preterm Labor precautions and fetal kick counts  Follow-up [place] for [facility] Follow up.   Specialty:  Obstetrics and Gynecology Why:  Schedule your next appointment Contact information: [place] [phone]          Allergies as of 06/16/2018   No Known Allergies     Medication List    TAKE these medications   acetaminophen 325 MG tablet Commonly known as:  TYLENOL Take 650 mg by mouth every 6 (six) hours as needed for moderate pain.   glycopyrrolate 1 MG tablet Commonly known as:  ROBINUL Take 1 tablet (1 mg total) by mouth 3 (three) times daily.   metoCLOPramide 10 MG tablet Commonly known as:  REGLAN Take 1 tablet (10 mg total) by mouth every 6 (six) hours as needed for nausea (nausea/headache).   NIFEdipine 10 MG capsule Commonly known as:  PROCARDIA Take 1 capsule (10 mg total) by mouth every 6 (six) hours as needed.   ondansetron 4 MG disintegrating tablet Commonly known as:  ZOFRAN-ODT Take 1 tablet (4 mg total) by mouth every 8 (eight) hours as needed for nausea or vomiting. When phenergan or reglan don't work   prenatal multivitamin Tabs tablet Take 1 tablet by mouth daily at 12 noon.   progesterone 200 MG capsule Commonly known as:  PROMETRIUM Place one capsule vaginally at bedtime   promethazine 25  MG suppository Commonly known as:  PHENERGAN Place 1 suppository (25 mg total) rectally every 6 (six) hours as needed for nausea or vomiting. USE RECTALLY AT BEDTIME   promethazine 25 MG tablet Commonly known as:  PHENERGAN Take 1 tablet (25 mg total) by mouth every 6 (six) hours as needed.   ranitidine 150 MG tablet Commonly known as:  ZANTAC Take 1 tablet (150 mg total) by mouth 2 (two) times daily.   scopolamine 1 MG/3DAYS Commonly known as:  TRANSDERM-SCOP Place 1 patch (1.5 mg total)  onto the skin every 3 (three) days.       Jorje Guild, NP 06/16/2018 12:50 PM

## 2018-06-16 NOTE — Progress Notes (Signed)
Pt having contractions, started this morning.Started while in lobby.

## 2018-06-16 NOTE — Patient Instructions (Signed)
Informacin sobre parto y [place] de parto prematuros Preterm Labor and Birth Information El embarazo tiene generalmente una duracin de 39 a 41 semanas. El [place] de parto es prematuro cuando se inicia muy pronto. Comienza antes de completar las 37 semanas de [place]. Cules son los factores de riesgo del [place] de [place] prematuro? Existen mayores probabilidades de trabajo de parto prematuro en mujeres con las siguientes caractersticas:  Tuvieron una infeccin Solicitor.  El cuello uterino es corto.  Tuvieron trabajo de parto prematuro anteriormente.  Se sometieron a una ciruga en el cuello uterino.  Son menores de 17aos.  Tienen ms de 35aos.  Son afroamericanas.  Estn embarazadas de dos o ms bebs.  Consumen drogas mientras estn embarazadas.  Fuman mientras estn embarazadas.  No aumentan de peso lo suficiente durante el [employer].  Se embarazaron inmediatamente despus de Psychologist, clinical.  Cules son los sntomas del [place] prematuro? Los sntomas del trabajo de parto prematuro incluyen lo siguiente:  Marketing executive. Los calambres pueden parecerse a los que tiene una mujer durante el perodo menstrual. Los calambres pueden presentarse con diarrea.  Dolor de vientre (abdomen).  Dolor en la zona lumbar.  Tiene contracciones regulares o endurecimiento del tero. Siente como si el vientre se endurece.  Presin en la zona inferior del vientre que Futures trader.  Pierde ms lquido (secrecin) por la vagina. El lquido puede ser acuoso o con [place].  Ruptura de la bolsa de aguas.  Por qu es importante notar los signos del [place] de [place] prematuro? Los bebs que nacen antes de tiempo pueden no estar completamente desarrollados. Estos pueden tener un riesgo mayor de padecer:  Problemas cardacos a Barrister's clerk.  Problemas pulmonares a Barrister's clerk.  Dificultades para controlar los sistemas corporales, por ejemplo, respirar.  Hemorragia  cerebral.  Una afeccin que se denomina parlisis cerebral.  Dificultades en el aprendizaje.  Muerte.  Estos riesgos son [employer] para bebs que nacen antes de las 34semanas de [place]. Cmo se trata Leander Rams de parto prematuro? El tratamiento depende de lo siguiente:  El tiempo de [place].  Su estado de [place].  La salud del beb.  El tratamiento puede incluir lo siguiente:  Un punto (sutura) en el cuello uterino. Al parir, el cuello uterino se abre para que el beb pueda salir. El punto impide que el cuello uterino se abra antes de [place].  Permanecer en el hospital.  Tomar medicamentos como, por ejemplo: ? Medicamentos hormonales. ? Medicamentos para Scientist, water quality las contracciones. ? Medicamentos para ayudar a la maduracin de los pulmones del beb. ? Medicamentos para evitar que el beb desarrolle parlisis cerebral.  Qu debo hacer si estoy en [place] Plan prematuro? Si cree que est en trabajo de parto demasiado pronto, llame a su mdico de inmediato. Cmo puedo prevenir el trabajo de parto prematuro?  No use productos que contengan tabaco. ? Estos incluyen cigarrillos, tabaco para Higher education careers adviser y Psychologist, sport and exercise. ? Si necesita ayuda para dejar de fumar, consulte al mdico.  No consuma drogas.  No tome ningn medicamento si el mdico no se lo indic.  Consulte al mdico antes de empezar a tomar cualquier suplemento de hierbas.  Asegrese aumentar de peso como corresponde.  Tenga cuidado con las infecciones. Si cree que puede tener una infeccin, consulte al mdico para que la revisen inmediatamente.  Infrmele al mdico si ha tenido trabajo de parto prematuro anteriormente. Esta informacin no tiene Marine scientist el consejo del mdico. Asegrese de hacerle al mdico  cualquier pregunta que tenga. Document Released: 10/10/2010 Document Revised: 12/16/2016 Document Reviewed: 01/29/2016 Elsevier Interactive Patient Education  [employer].

## 2018-06-16 NOTE — Discharge Instructions (Signed)
Informacin sobre parto y [place] de parto prematuros (Preterm Labor and Birth Information) La duracin de un embarazo normal es de 67 a 41semanas. Se llama trabajo de parto prematuro cuando se inicia antes de las 37semanas de [place]. [place]? Existen mayores probabilidades de trabajo de parto prematuro en mujeres con las siguientes caractersticas:  Tienen ciertas infecciones durante el embarazo, como infeccin de vejiga, infeccin de transmisin sexual o infeccin en el tero (corioamnionitis).  Tienen el cuello del tero ms corto que lo normal.  Tuvieron trabajo de parto prematuro anteriormente.  Se sometieron a una ciruga en el cuello del tero.  Son menores de 17aos o mayores de 60aos de edad.  Son afroamericanas.  Estn embarazadas de [employer] o de varios bebs (gestacin mltiple).  Consumen drogas o fuman mientras estn embarazadas.  No aumentan de peso lo suficiente durante el [employer].  Se embarazan poco despus de [employer] CULES SON LOS SNTOMAS DEL [place]? Los sntomas del trabajo de parto prematuro incluyen lo siguiente:  Marketing executive similares a los que ocurren durante el perodo menstrual. Los calambres pueden presentarse con diarrea.  Dolor en el abdomen o en la parte inferior de la espalda.  Contracciones uterinas regulares que se pueden sentir como una presin en el abdomen.  Una sensacin de mayor presin en la pelvis.  Aumento de la secrecin de moco acuoso o sanguinolento en la vagina.  Rotura de bolsa (rotura de saco amnitico). POR QU ES IMPORTANTE RECONOCER LOS SIGNOS DEL [place]? Es Glass blower/designer los signos del trabajo de parto prematuro porque los bebs que nacen de forma prematura pueden no estar completamente desarrollados. Por lo tanto, pueden correr mayor riesgo de lo siguiente:  Problemas cardacos y pulmonares a  Barrister's clerk (crnicos).  Inmediatamente despus del parto, dificultades para regular los sistemas corporales, que incluyen glucemia, temperatura corporal, frecuencia cardaca y frecuencia respiratoria.  Hemorragia cerebral.  Parlisis cerebral.  Dificultades en el aprendizaje.  Muerte. Estos riesgos son [employer] para bebs que nacen antes de las 34semanas de [place]. [place]? El tratamiento depende del tiempo de su [place], su afeccin y la salud de su beb. Puede incluir lo siguiente:  Tener un punto (sutura) en el cuello del tero para evitar que este se abra demasiado pronto (cerclaje).  Tomar medicamentos, por ejemplo: ? Medicamentos hormonales. Estos se pueden administrar de forma temprana en el embarazo para ayudar a Comptroller. ? [place] contracciones. ? Medicamentos que ayudan a [employer] del beb. Estos se pueden recetar si el riesgo de parto es [place]. ? Medicamentos para evitar que el beb desarrolle parlisis cerebral. Si el trabajo de parto de inicia antes de las 34semanas de [place], es posible que deba hospitalizarse. QU DEBO HACER SI CREO QUE ESTOY EN TRABAJO DE [place]? Si cree que est iniciando trabajo de parto prematuro, llame al mdico de inmediato. [employer] DE PARTO PREMATURO EN FUTUROS EMBARAZOS? Para aumentar las probabilidades de tener un embarazo a trmino, Dance movement psychotherapist en cuenta lo siguiente:  No consuma ningn producto que contenga tabaco, lo que incluye cigarrillos, tabaco de Higher education careers adviser y Psychologist, sport and exercise. Si necesita ayuda para dejar de fumar, consulte al mdico.  No consuma drogas ni medicamentos que no sean recetados Solicitor.  Hable con el mdico antes de tomar suplementos a base de hierbas aunque los haya estado tomando  peridicamente.  Asegrese de llegar a un peso Tax adviser.  Tenga cuidado con las  infecciones. Si cree que puede tener una infeccin, consulte al mdico para que la revisen.  Asegrese de informarle al mdico si ha tenido trabajo de parto prematuro antes. Esta informacin no tiene Marine scientist el consejo del mdico. Asegrese de hacerle al mdico cualquier pregunta que tenga. Document Released: 12/15/2007 Document Revised: 05/10/2013 Document Reviewed: 01/29/2016 Elsevier Interactive Patient Education  [employer].

## 2018-06-16 NOTE — MAU Note (Signed)
Pt sent from the office for SVE of 2-3 in office.  Pt continues to have pain and N&V throughout pregnancy with no changes today.

## 2018-06-16 NOTE — Progress Notes (Signed)
Patient to go to MAU, report  Called to [place] charge RN and patient taken to mau.

## 2018-06-27 ENCOUNTER — Other Ambulatory Visit: Payer: Self-pay

## 2018-06-27 ENCOUNTER — Encounter (HOSPITAL_COMMUNITY): Payer: Self-pay

## 2018-06-27 ENCOUNTER — Inpatient Hospital Stay (HOSPITAL_COMMUNITY)
Admission: AD | Admit: 2018-06-27 | Discharge: 2018-06-27 | Disposition: A | Discharge status: Home / Self Care | Payer: Self-pay | Source: Ambulatory Visit | Attending: Obstetrics & Gynecology | Admitting: Obstetrics & Gynecology

## 2018-06-27 ENCOUNTER — Encounter: Payer: Self-pay | Admitting: Obstetrics & Gynecology

## 2018-06-27 DIAGNOSIS — O4703 False labor before 37 completed weeks of gestation, third trimester: Secondary | ICD-10-CM

## 2018-06-27 DIAGNOSIS — E860 Dehydration: Secondary | ICD-10-CM

## 2018-06-27 DIAGNOSIS — Z3689 Encounter for other specified antenatal screening: Secondary | ICD-10-CM

## 2018-06-27 DIAGNOSIS — Z3A34 34 weeks gestation of pregnancy: Secondary | ICD-10-CM | POA: Insufficient documentation

## 2018-06-27 LAB — WET PREP, GENITAL
CLUE CELLS WET PREP: NONE SEEN
Sperm: NONE SEEN
TRICH WET PREP: NONE SEEN
Yeast Wet Prep HPF POC: NONE SEEN

## 2018-06-27 LAB — URINALYSIS, ROUTINE W REFLEX MICROSCOPIC
BILIRUBIN URINE: NEGATIVE
Glucose, UA: NEGATIVE mg/dL
Hgb urine dipstick: NEGATIVE
Ketones, ur: 80 mg/dL — AB
LEUKOCYTES UA: NEGATIVE
Nitrite: NEGATIVE
PH: 8 (ref 5.0–8.0)
Protein, ur: 30 mg/dL — AB
Specific Gravity, Urine: 1.019 (ref 1.005–1.030)

## 2018-06-27 MED ORDER — NIFEDIPINE 10 MG PO CAPS
10.0000 mg | ORAL_CAPSULE | Freq: Once | ORAL | Status: DC
  Filled 2018-06-27: qty 1

## 2018-06-27 MED ORDER — LACTATED RINGERS IV BOLUS
1000.0000 mL | Freq: Once | INTRAVENOUS | Status: AC
  Administered 2018-06-27: 1000 mL via INTRAVENOUS

## 2018-06-27 MED ORDER — SODIUM CHLORIDE 0.9 % IV SOLN
8.0000 mg | Freq: Once | INTRAVENOUS | Status: AC
  Administered 2018-06-27: 8 mg via INTRAVENOUS
  Filled 2018-06-27: qty 4

## 2018-06-27 NOTE — MAU Note (Signed)
IV d/c'd at 1700, clean and intact per order pt is discharged to home

## 2018-06-27 NOTE — Progress Notes (Signed)
Vaginal cultures obtained by M. Drake Leach, CNM

## 2018-06-27 NOTE — MAU Provider Note (Signed)
History     CSN: [id number]  Arrival date and time: 06/27/18 1126   First Provider Initiated Contact with Patient 06/27/18 1231      Chief Complaint  Patient presents with  . Contractions   [id number] @34 .3 wks here with ctx. Ctx started last night. Frequency is q5 min. No VB or LOF. No recent IC. Feeling good FM. Having N/V this am. Cannot tolerate po. Took Zofran but vomited immediately after. Her pregnancy is complicated by HEG and PTD x2. She is using Prometrium.   OB History    Gravida  4   Para  3   Term  1   Preterm  2   AB  0   Living  3     SAB  0   TAB  0   Ectopic  0   Multiple  0   Live Births  3        Obstetric Comments  2014 approx 28 weeks 2010 approx 30 weeks        Past Medical History:  Diagnosis Date  . Preterm labor   . Uterine fibroid     Past Surgical History:  Procedure Laterality Date  . APPENDECTOMY  2015   in [place]    Family History  Problem Relation Age of Onset  . Diabetes Mother   . Diabetes Brother     Social History   Tobacco Use  . Smoking status: Never Smoker  . Smokeless tobacco: Never Used  Substance Use Topics  . Alcohol use: Not Currently  . Drug use: Not Currently    Allergies: No Known Allergies  Medications Prior to Admission  Medication Sig Dispense Refill Last Dose  . acetaminophen (TYLENOL) 325 MG tablet Take 650 mg by mouth every 6 (six) hours as needed for moderate pain.   06/09/2018 at Unknown time  . glycopyrrolate (ROBINUL) 1 MG tablet Take 1 tablet (1 mg total) by mouth 3 (three) times daily. 90 tablet 0 Unknown at Unknown time  . metoCLOPramide (REGLAN) 10 MG tablet Take 1 tablet (10 mg total) by mouth every 6 (six) hours as needed for nausea (nausea/headache). (Patient not taking: Reported on 05/27/2018) 6 tablet 0 Unknown at Unknown time  . NIFEdipine (PROCARDIA) 10 MG capsule Take 1 capsule (10 mg total) by mouth every 6 (six) hours as needed. 30 capsule 0   . ondansetron (ZOFRAN  ODT) 4 MG disintegrating tablet Take 1 tablet (4 mg total) by mouth every 8 (eight) hours as needed for nausea or vomiting. When phenergan or reglan don't work 20 tablet 0 06/09/2018 at Unknown time  . Prenatal Vit-Fe Fumarate-FA (PRENATAL MULTIVITAMIN) TABS tablet Take 1 tablet by mouth daily at 12 noon.   Unknown at Unknown time  . progesterone (PROMETRIUM) 200 MG capsule Place one capsule vaginally at bedtime 30 capsule 2 Past Week at Unknown time  . promethazine (PHENERGAN) 25 MG suppository Place 1 suppository (25 mg total) rectally every 6 (six) hours as needed for nausea or vomiting. USE RECTALLY AT BEDTIME 12 each 3 Unknown at Unknown time  . promethazine (PHENERGAN) 25 MG tablet Take 1 tablet (25 mg total) by mouth every 6 (six) hours as needed. (Patient not taking: Reported on 05/12/2018) 30 tablet 1 Unknown at Unknown time  . ranitidine (ZANTAC) 150 MG tablet Take 1 tablet (150 mg total) by mouth 2 (two) times daily. 60 tablet 0 Unknown at Unknown time  . scopolamine (TRANSDERM-SCOP, 1.5 MG,) 1 MG/3DAYS Place 1 patch (1.5 mg total)  onto the skin every 3 (three) days. (Patient not taking: Reported on 05/27/2018) 10 patch 12 Unknown at Unknown time    Review of Systems  Gastrointestinal: Positive for abdominal pain, nausea and vomiting.  Genitourinary: Negative for vaginal bleeding and vaginal discharge.   Physical Exam   Blood pressure 111/75, pulse 98, temperature 97.7 F (36.5 C), temperature source Oral, resp. rate 18, weight 53.5 kg, last menstrual period 11/19/2017, SpO2 100 %, unknown if currently breastfeeding.  Physical Exam  Constitutional: She is oriented to person, place, and time. She appears well-developed and well-nourished. No distress.  HENT:  Head: Normocephalic and atraumatic.  Neck: Normal range of motion.  Cardiovascular: Normal rate.  Respiratory: Effort normal. No respiratory distress.  GI: Soft. She exhibits no distension. There is no tenderness.  gravid   Genitourinary:  Genitourinary Comments: SVE 2.5/50/-2, vtx  Musculoskeletal: Normal range of motion.  Neurological: She is alert and oriented to person, place, and time.  Skin: Skin is warm and dry.  Psychiatric: She has a normal mood and affect.  EFM: 130 bpm, mod variability, + accels, no decels Toco: irregular with irritability  Results for orders placed or performed during the hospital encounter of 06/27/18 (from the past 24 hour(s))  Urinalysis, Routine w reflex microscopic     Status: Abnormal   Collection Time: 06/27/18 11:45 AM  Result Value Ref Range   Color, Urine YELLOW YELLOW   APPearance CLEAR CLEAR   Specific Gravity, Urine 1.019 1.005 - 1.030   pH 8.0 5.0 - 8.0   Glucose, UA NEGATIVE NEGATIVE mg/dL   Hgb urine dipstick NEGATIVE NEGATIVE   Bilirubin Urine NEGATIVE NEGATIVE   Ketones, ur 80 (A) NEGATIVE mg/dL   Protein, ur 30 (A) NEGATIVE mg/dL   Nitrite NEGATIVE NEGATIVE   Leukocytes, UA NEGATIVE NEGATIVE   RBC / HPF 0-5 0 - 5 RBC/hpf   WBC, UA 0-5 0 - 5 WBC/hpf   Bacteria, UA RARE (A) NONE SEEN   Squamous Epithelial / LPF 0-5 0 - 5   Mucus PRESENT   Wet prep, genital     Status: Abnormal   Collection Time: 06/27/18 12:43 PM  Result Value Ref Range   Yeast Wet Prep HPF POC NONE SEEN NONE SEEN   Trich, Wet Prep NONE SEEN NONE SEEN   Clue Cells Wet Prep HPF POC NONE SEEN NONE SEEN   WBC, Wet Prep HPF POC MODERATE (A) NONE SEEN   Sperm NONE SEEN    MAU Course  Procedures LR Zofran  MDM Labs ordered and reviewed. No emesis. Pt reports having ctx 5 min, irregular ctx noted on toco. Cervix unchanged. Will give Procardia for comfort and discharge home.   Assessment and Plan   1. Preterm uterine contractions in third trimester, antepartum   2. [redacted] weeks gestation of pregnancy   3. NST (non-stress test) reactive    Discharge home Follow up in OB office as scheduled PTL precautions Continue antiemetics Procardia prn (has Rx)  Allergies as of 06/27/2018    No Known Allergies     Medication List    STOP taking these medications   ranitidine 150 MG tablet Commonly known as:  ZANTAC     TAKE these medications   acetaminophen 325 MG tablet Commonly known as:  TYLENOL Take 650 mg by mouth every 6 (six) hours as needed for moderate pain.   glycopyrrolate 1 MG tablet Commonly known as:  ROBINUL Take 1 tablet (1 mg total) by mouth 3 (three) times daily.  metoCLOPramide 10 MG tablet Commonly known as:  REGLAN Take 1 tablet (10 mg total) by mouth every 6 (six) hours as needed for nausea (nausea/headache).   NIFEdipine 10 MG capsule Commonly known as:  PROCARDIA Take 1 capsule (10 mg total) by mouth every 6 (six) hours as needed.   ondansetron 4 MG disintegrating tablet Commonly known as:  ZOFRAN-ODT Take 1 tablet (4 mg total) by mouth every 8 (eight) hours as needed for nausea or vomiting. When phenergan or reglan don't work   prenatal multivitamin Tabs tablet Take 1 tablet by mouth daily at 12 noon.   progesterone 200 MG capsule Commonly known as:  PROMETRIUM Place one capsule vaginally at bedtime   promethazine 25 MG suppository Commonly known as:  PHENERGAN Place 1 suppository (25 mg total) rectally every 6 (six) hours as needed for nausea or vomiting. USE RECTALLY AT BEDTIME   promethazine 25 MG tablet Commonly known as:  PHENERGAN Take 1 tablet (25 mg total) by mouth every 6 (six) hours as needed.   scopolamine 1 MG/3DAYS Commonly known as:  TRANSDERM-SCOP Place 1 patch (1.5 mg total) onto the skin every 3 (three) days.      Live interpreter at bedside  Julianne Handler, [place] 06/27/2018, 4:43 PM

## 2018-06-27 NOTE — Discharge Instructions (Signed)
Braxton Hicks Contractions °Contractions of the uterus can occur throughout pregnancy, but they are not always a sign that you are in labor. You may have practice contractions called Braxton Hicks contractions. These false labor contractions are sometimes confused with true labor. °What are Braxton Hicks contractions? °Braxton Hicks contractions are tightening movements that occur in the muscles of the uterus before labor. Unlike true labor contractions, these contractions do not result in opening (dilation) and thinning of the cervix. Toward the end of pregnancy (32-34 weeks), Braxton Hicks contractions can happen more often and may become stronger. These contractions are sometimes difficult to tell apart from true labor because they can be very uncomfortable. You should not feel embarrassed if you go to the hospital with false labor. °Sometimes, the only way to tell if you are in true labor is for your health care provider to look for changes in the cervix. The health care provider will do a physical exam and may monitor your contractions. If you are not in true labor, the exam should show that your cervix is not dilating and your water has not broken. °If there are other health problems associated with your pregnancy, it is completely safe for you to be sent home with false labor. You may continue to have Braxton Hicks contractions until you go into true labor. °How to tell the difference between true labor and false labor °True labor °· Contractions last 30-70 seconds. °· Contractions become very regular. °· Discomfort is usually felt in the top of the uterus, and it spreads to the lower abdomen and low back. °· Contractions do not go away with walking. °· Contractions usually become more intense and increase in frequency. °· The cervix dilates and gets thinner. °False labor °· Contractions are usually shorter and not as strong as true labor contractions. °· Contractions are usually irregular. °· Contractions  are often felt in the front of the lower abdomen and in the groin. °· Contractions may go away when you walk around or change positions while lying down. °· Contractions get weaker and are shorter-lasting as time goes on. °· The cervix usually does not dilate or become thin. °Follow these instructions at home: °· Take over-the-counter and prescription medicines only as told by your health care provider. °· Keep up with your usual exercises and follow other instructions from your health care provider. °· Eat and drink lightly if you think you are going into labor. °· If Braxton Hicks contractions are making you uncomfortable: °? Change your position from lying down or resting to walking, or change from walking to resting. °? Sit and rest in a tub of warm water. °? Drink enough fluid to keep your urine pale yellow. Dehydration may cause these contractions. °? Do slow and deep breathing several times an hour. °· Keep all follow-up prenatal visits as told by your health care provider. This is important. °Contact a health care provider if: °· You have a fever. °· You have continuous pain in your abdomen. °Get help right away if: °· Your contractions become stronger, more regular, and closer together. °· You have fluid leaking or gushing from your vagina. °· You pass blood-tinged mucus (bloody show). °· You have bleeding from your vagina. °· You have low back pain that you never had before. °· You feel your baby’s head pushing down and causing pelvic pressure. °· Your baby is not moving inside you as much as it used to. °Summary °· Contractions that occur before labor are called Braxton   Hicks contractions, false labor, or practice contractions. °· Braxton Hicks contractions are usually shorter, weaker, farther apart, and less regular than true labor contractions. True labor contractions usually become progressively stronger and regular and they become more frequent. °· Manage discomfort from Braxton Hicks contractions by  changing position, resting in a warm bath, drinking plenty of water, or practicing deep breathing. °This information is not intended to replace advice given to you by your health care provider. Make sure you discuss any questions you have with your health care provider. °Document Released: 01/21/2017 Document Revised: 01/21/2017 Document Reviewed: 01/21/2017 °Elsevier Interactive Patient Education © 2018 [employer] ° °

## 2018-06-27 NOTE — MAU Note (Signed)
Pt states contractions since last night

## 2018-06-28 LAB — GC/CHLAMYDIA PROBE AMP (~~LOC~~) NOT AT ARMC
CHLAMYDIA, DNA PROBE: NEGATIVE
NEISSERIA GONORRHEA: NEGATIVE

## 2018-07-11 ENCOUNTER — Telehealth: Payer: Self-pay | Admitting: Obstetrics & Gynecology

## 2018-07-11 ENCOUNTER — Encounter: Payer: Self-pay | Admitting: Obstetrics & Gynecology

## 2018-07-11 NOTE — Telephone Encounter (Signed)
Called with Interpreter to remind patient about her upcoming appointment because she missed the one from the day.

## 2018-07-12 ENCOUNTER — Inpatient Hospital Stay (HOSPITAL_COMMUNITY)
Admission: AD | Admit: 2018-07-12 | Discharge: 2018-07-12 | Disposition: A | Discharge status: Home / Self Care | Payer: Self-pay | Source: Ambulatory Visit | Attending: Obstetrics and Gynecology | Admitting: Obstetrics and Gynecology

## 2018-07-12 ENCOUNTER — Encounter (HOSPITAL_COMMUNITY): Payer: Self-pay | Admitting: *Deleted

## 2018-07-12 DIAGNOSIS — N9089 Other specified noninflammatory disorders of vulva and perineum: Secondary | ICD-10-CM

## 2018-07-12 DIAGNOSIS — O99820 Streptococcus B carrier state complicating pregnancy: Secondary | ICD-10-CM

## 2018-07-12 DIAGNOSIS — D259 Leiomyoma of uterus, unspecified: Secondary | ICD-10-CM

## 2018-07-12 DIAGNOSIS — O3413 Maternal care for benign tumor of corpus uteri, third trimester: Secondary | ICD-10-CM

## 2018-07-12 DIAGNOSIS — Z3A36 36 weeks gestation of pregnancy: Secondary | ICD-10-CM | POA: Insufficient documentation

## 2018-07-12 DIAGNOSIS — O4703 False labor before 37 completed weeks of gestation, third trimester: Secondary | ICD-10-CM | POA: Insufficient documentation

## 2018-07-12 DIAGNOSIS — N898 Other specified noninflammatory disorders of vagina: Secondary | ICD-10-CM | POA: Insufficient documentation

## 2018-07-12 DIAGNOSIS — O479 False labor, unspecified: Secondary | ICD-10-CM

## 2018-07-12 HISTORY — DX: Vomiting, unspecified: R11.10

## 2018-07-12 LAB — URINALYSIS, ROUTINE W REFLEX MICROSCOPIC
BILIRUBIN URINE: NEGATIVE
Glucose, UA: NEGATIVE mg/dL
Hgb urine dipstick: NEGATIVE
Ketones, ur: 80 mg/dL — AB
Leukocytes, UA: NEGATIVE
NITRITE: NEGATIVE
PH: 8 (ref 5.0–8.0)
Protein, ur: NEGATIVE mg/dL
Specific Gravity, Urine: 1.017 (ref 1.005–1.030)

## 2018-07-12 LAB — WET PREP, GENITAL
Clue Cells Wet Prep HPF POC: NONE SEEN
SPERM: NONE SEEN
Trich, Wet Prep: NONE SEEN
Yeast Wet Prep HPF POC: NONE SEEN

## 2018-07-12 LAB — OB RESULTS CONSOLE GC/CHLAMYDIA: Gonorrhea: NEGATIVE

## 2018-07-12 NOTE — MAU Note (Signed)
Patient reports CTX started this AM around 0200 and are coming every 5 minutes.  Denies LOF or vaginal bleeding.  Reports good fetal movement.

## 2018-07-12 NOTE — MAU Provider Note (Addendum)
History     CSN: [id number]  Arrival date and time: 07/12/18 1123   First Provider Initiated Contact with Patient 07/12/18 1410      Chief Complaint  Patient presents with  . Contractions   HPI  [name] is a 37 y.o. year old [id number] female at [redacted]w[redacted]d weeks gestation who presents to MAU reporting UC's every 5 mins since 0200 this AM. She denies VB & LOF. She reports good (+) FM. She also reports "irritation" when she pees and increased frequency. She is unsure if it is a UTI or vaginal yeast infection. She received ONC at [facility], but missed her OB appt on 07/11/18.  Past Medical History:  Diagnosis Date  . Hyperemesis   . Preterm labor   . Uterine fibroid     Past Surgical History:  Procedure Laterality Date  . APPENDECTOMY  2015   in [place]    Family History  Problem Relation Age of Onset  . Diabetes Mother   . Diabetes Brother     Social History   Tobacco Use  . Smoking status: Never Smoker  . Smokeless tobacco: Never Used  Substance Use Topics  . Alcohol use: Not Currently  . Drug use: Not Currently    Allergies: No Known Allergies  Medications Prior to Admission  Medication Sig Dispense Refill Last Dose  . acetaminophen (TYLENOL) 325 MG tablet Take 650 mg by mouth every 6 (six) hours as needed for moderate pain.   06/09/2018 at Unknown time  . glycopyrrolate (ROBINUL) 1 MG tablet Take 1 tablet (1 mg total) by mouth 3 (three) times daily. 90 tablet 0 Unknown at Unknown time  . metoCLOPramide (REGLAN) 10 MG tablet Take 1 tablet (10 mg total) by mouth every 6 (six) hours as needed for nausea (nausea/headache). (Patient not taking: Reported on 05/27/2018) 6 tablet 0 Unknown at Unknown time  . NIFEdipine (PROCARDIA) 10 MG capsule Take 1 capsule (10 mg total) by mouth every 6 (six) hours as needed. 30 capsule 0   . ondansetron (ZOFRAN ODT) 4 MG disintegrating tablet Take 1 tablet (4 mg total) by mouth every 8 (eight) hours as needed for nausea or  vomiting. When phenergan or reglan don't work 20 tablet 0 06/09/2018 at Unknown time  . Prenatal Vit-Fe Fumarate-FA (PRENATAL MULTIVITAMIN) TABS tablet Take 1 tablet by mouth daily at 12 noon.   Unknown at Unknown time  . progesterone (PROMETRIUM) 200 MG capsule Place one capsule vaginally at bedtime 30 capsule 2 Past Week at Unknown time  . promethazine (PHENERGAN) 25 MG suppository Place 1 suppository (25 mg total) rectally every 6 (six) hours as needed for nausea or vomiting. USE RECTALLY AT BEDTIME 12 each 3 Unknown at Unknown time  . promethazine (PHENERGAN) 25 MG tablet Take 1 tablet (25 mg total) by mouth every 6 (six) hours as needed. (Patient not taking: Reported on 05/12/2018) 30 tablet 1 Unknown at Unknown time  . scopolamine (TRANSDERM-SCOP, 1.5 MG,) 1 MG/3DAYS Place 1 patch (1.5 mg total) onto the skin every 3 (three) days. (Patient not taking: Reported on 05/27/2018) 10 patch 12 Unknown at Unknown time    Review of Systems  Constitutional: Negative.   HENT: Negative.   Eyes: Negative.   Respiratory: Negative.   Cardiovascular: Negative.   Gastrointestinal: Negative.   Endocrine: Negative.   Genitourinary: Positive for vaginal pain (irritation).  Musculoskeletal: Negative.   Skin: Negative.   Allergic/Immunologic: Negative.   Neurological: Negative.   Hematological: Negative.   Psychiatric/Behavioral: Negative.  Physical Exam   Blood pressure 113/88, pulse 90, temperature 98.1 F (36.7 C), resp. rate 16, weight 54 kg, last menstrual period 11/19/2017, SpO2 100 %.  Physical Exam  MAU Course  Procedures  MDM CCUA Wet Prep  GC/CT -- pending NST - FHR: 150 bpm / moderate variability / accels present / decels absent / TOCO: irregular UCs with UI noted   Results for orders placed or performed during the hospital encounter of 07/12/18 (from the past 24 hour(s))  Urinalysis, Routine w reflex microscopic     Status: Abnormal   Collection Time: 07/12/18  1:29 PM  Result  Value Ref Range   Color, Urine YELLOW YELLOW   APPearance CLEAR CLEAR   Specific Gravity, Urine 1.017 1.005 - 1.030   pH 8.0 5.0 - 8.0   Glucose, UA NEGATIVE NEGATIVE mg/dL   Hgb urine dipstick NEGATIVE NEGATIVE   Bilirubin Urine NEGATIVE NEGATIVE   Ketones, ur 80 (A) NEGATIVE mg/dL   Protein, ur NEGATIVE NEGATIVE mg/dL   Nitrite NEGATIVE NEGATIVE   Leukocytes, UA NEGATIVE NEGATIVE  Wet prep, genital     Status: Abnormal   Collection Time: 07/12/18  2:31 PM  Result Value Ref Range   Yeast Wet Prep HPF POC NONE SEEN NONE SEEN   Trich, Wet Prep NONE SEEN NONE SEEN   Clue Cells Wet Prep HPF POC NONE SEEN NONE SEEN   WBC, Wet Prep HPF POC FEW (A) NONE SEEN   Sperm NONE SEEN      Assessment and Plan  Irregular contractions - Labor precautions reviewed - Advised to keep scheduled appt on 07/18/18  Vulvar irritation  - Abrasions from patient shaved he vulva this morning  - Advised to stop shaving and get waxed or clip with scissors  - Discharge patient - Keep scheduled appt on 07/18/18 at [facility] - Patient verbalized an understanding of the plan of care and agrees.      Laury Deep, MSN, CNM 07/12/2018, 2:33 PM

## 2018-07-13 LAB — GC/CHLAMYDIA PROBE AMP (~~LOC~~) NOT AT ARMC
CHLAMYDIA, DNA PROBE: NEGATIVE
NEISSERIA GONORRHEA: NEGATIVE

## 2018-07-18 ENCOUNTER — Ambulatory Visit (INDEPENDENT_AMBULATORY_CARE_PROVIDER_SITE_OTHER): Payer: Self-pay | Admitting: Family Medicine

## 2018-07-18 VITALS — BP 126/87 | HR 98 | Wt 118.0 lb

## 2018-07-18 DIAGNOSIS — Z789 Other specified health status: Secondary | ICD-10-CM

## 2018-07-18 DIAGNOSIS — O26843 Uterine size-date discrepancy, third trimester: Secondary | ICD-10-CM

## 2018-07-18 DIAGNOSIS — Z3480 Encounter for supervision of other normal pregnancy, unspecified trimester: Secondary | ICD-10-CM

## 2018-07-18 DIAGNOSIS — O99820 Streptococcus B carrier state complicating pregnancy: Secondary | ICD-10-CM

## 2018-07-18 DIAGNOSIS — O09212 Supervision of pregnancy with history of pre-term labor, second trimester: Secondary | ICD-10-CM

## 2018-07-18 DIAGNOSIS — O09892 Supervision of other high risk pregnancies, second trimester: Secondary | ICD-10-CM

## 2018-07-18 DIAGNOSIS — O09522 Supervision of elderly multigravida, second trimester: Secondary | ICD-10-CM

## 2018-07-18 NOTE — Progress Notes (Signed)
   PRENATAL VISIT NOTE  Subjective:  Gabriela Morgan is a 37 y.o. [phone] at [redacted]w[redacted]d being seen today for ongoing prenatal care.  She is currently monitored for the following issues for this high-risk pregnancy and has Hyperemesis affecting pregnancy, antepartum; Supervision of other normal pregnancy, antepartum; Limited prenatal care in second trimester; Uterine fibroids affecting pregnancy in third trimester; Language barrier; Hx of preterm delivery, currently pregnant, second trimester; AMA (advanced maternal age) multigravida 18+, second trimester; Abdominal pain in pregnancy, second trimester; Dehydration; and Group B Streptococcus carrier, antepartum on their problem list.  Patient reports occasional contractions.  Contractions: Irregular. Vag. Bleeding: None.  Movement: Present. Denies leaking of fluid.   The following portions of the patient's history were reviewed and updated as appropriate: allergies, current medications, past family history, past medical history, past social history, past surgical history and problem list. Problem list updated.  Objective:   Vitals:   07/18/18 1606  BP: 126/87  Pulse: 98  Weight: 118 lb (53.5 kg)    Fetal Status: Fetal Heart Rate (bpm): 156 Fundal Height: 34 cm Movement: Present     General:  Alert, oriented and cooperative. Patient is in no acute distress.  Skin: Skin is warm and dry. No rash noted.   Cardiovascular: Normal heart rate noted  Respiratory: Normal respiratory effort, no problems with respiration noted  Abdomen: Soft, gravid, appropriate for gestational age.  Pain/Pressure: Present     Pelvic: Cervical exam performed        Extremities: Normal range of motion.     Mental Status: Normal mood and affect. Normal behavior. Normal judgment and thought content.   Assessment and Plan:  Pregnancy: [id number] at [redacted]w[redacted]d  1. Supervision of other normal pregnancy, antepartum FHT normal. For some reason, Rubella and HepB testing not done.  Will get today. - Hepatitis B surface antigen - Rubella screen  2. Group B Streptococcus carrier, antepartum Intrapartum prophylaxis  3. AMA (advanced maternal age) multigravida 69+, second trimester  4. Hx of preterm delivery, currently pregnant, second trimester  5. Language barrier Interpreter used  6. Size of fetus inconsistent with dates in third trimester Measuring small. Will get [place]. - [place] MFM OB FOLLOW UP; Future  Preterm labor symptoms and general obstetric precautions including but not limited to vaginal bleeding, contractions, leaking of fluid and fetal movement were reviewed in detail with the patient. Please refer to After Visit Summary for other counseling recommendations.  Return in about 1 week (around 07/25/2018) for HR OB f/u.  No future appointments.  Truett Mainland, DO

## 2018-07-19 LAB — RUBELLA SCREEN: Rubella Antibodies, IGG: 2.17 index (ref >0.99)

## 2018-07-19 LAB — HEPATITIS B SURFACE ANTIGEN: Hepatitis B Surface Ag: NEGATIVE

## 2018-07-24 ENCOUNTER — Inpatient Hospital Stay (HOSPITAL_COMMUNITY)
Admission: AD | Admit: 2018-07-24 | Discharge: 2018-07-24 | Disposition: A | Discharge status: Home / Self Care | Payer: Self-pay | Source: Ambulatory Visit | Attending: Obstetrics and Gynecology | Admitting: Obstetrics and Gynecology

## 2018-07-24 ENCOUNTER — Encounter (HOSPITAL_COMMUNITY): Payer: Self-pay | Admitting: Obstetrics and Gynecology

## 2018-07-24 DIAGNOSIS — O26893 Other specified pregnancy related conditions, third trimester: Secondary | ICD-10-CM

## 2018-07-24 DIAGNOSIS — O210 Mild hyperemesis gravidarum: Secondary | ICD-10-CM | POA: Insufficient documentation

## 2018-07-24 DIAGNOSIS — Z3A38 38 weeks gestation of pregnancy: Secondary | ICD-10-CM | POA: Insufficient documentation

## 2018-07-24 DIAGNOSIS — O3413 Maternal care for benign tumor of corpus uteri, third trimester: Secondary | ICD-10-CM

## 2018-07-24 DIAGNOSIS — D259 Leiomyoma of uterus, unspecified: Secondary | ICD-10-CM

## 2018-07-24 DIAGNOSIS — R109 Unspecified abdominal pain: Secondary | ICD-10-CM

## 2018-07-24 LAB — URINALYSIS, ROUTINE W REFLEX MICROSCOPIC
Bacteria, UA: NONE SEEN
Bilirubin Urine: NEGATIVE
Glucose, UA: NEGATIVE mg/dL
Hgb urine dipstick: NEGATIVE
Ketones, ur: 80 mg/dL — AB
Leukocytes, UA: NEGATIVE
Nitrite: NEGATIVE
Protein, ur: 30 mg/dL — AB
Specific Gravity, Urine: 1.024 (ref 1.005–1.030)
pH: 6 (ref 5.0–8.0)

## 2018-07-24 MED ORDER — HYDROMORPHONE HCL 1 MG/ML IJ SOLN
1.0000 mg | Freq: Once | INTRAMUSCULAR | Status: AC
  Administered 2018-07-24: 1 mg via INTRAMUSCULAR
  Filled 2018-07-24: qty 1

## 2018-07-24 MED ORDER — PROMETHAZINE HCL 25 MG/ML IJ SOLN
25.0000 mg | Freq: Once | INTRAMUSCULAR | Status: AC
  Administered 2018-07-24: 25 mg via INTRAMUSCULAR
  Filled 2018-07-24: qty 1

## 2018-07-24 NOTE — MAU Note (Signed)
Gabriela Morgan is a 37 y.o. at [redacted]w[redacted]d here in MAU reporting: +abdominal pain that is constant and sharp. +nausea--patient actively vomiting during triage Pain score: 10/10. Has not taken any medication for the pain Today's Vitals   07/24/18 1012 07/24/18 1013  BP: 117/89   Pulse: 94   Resp: 18   Temp: 98 F (36.7 C)   TempSrc: Oral   SpO2: 100%   PainSc:  10-Worst pain ever    FHT:140 via external Lab orders placed from triage: ua

## 2018-07-24 NOTE — MAU Provider Note (Signed)
History     CSN: [id number]  Arrival date and time: 07/24/18 7322   First Provider Initiated Contact with Patient 07/24/18 1054      Chief Complaint  Patient presents with  . Abdominal Pain  . Emesis   HPI  [name] is a 37 y.o. year old [id number] female at [redacted]w[redacted]d weeks gestation who presents to MAU reporting constant, sharp abdominal pain, N/V. She has not taken anything for the pain or N/V. She has a h/o hyperemesis and has medications to take for N/V. She denies VB or LOF. She reports good (+) FM.   Past Medical History:  Diagnosis Date  . Hyperemesis   . Preterm labor   . Uterine fibroid     Past Surgical History:  Procedure Laterality Date  . APPENDECTOMY  2015   in [place]    Family History  Problem Relation Age of Onset  . Diabetes Mother   . Diabetes Brother     Social History   Tobacco Use  . Smoking status: Never Smoker  . Smokeless tobacco: Never Used  Substance Use Topics  . Alcohol use: Not Currently  . Drug use: Not Currently    Allergies: No Known Allergies  Medications Prior to Admission  Medication Sig Dispense Refill Last Dose  . acetaminophen (TYLENOL) 325 MG tablet Take 650 mg by mouth every 6 (six) hours as needed for moderate pain.   Taking  . NIFEdipine (PROCARDIA) 10 MG capsule Take 1 capsule (10 mg total) by mouth every 6 (six) hours as needed. 30 capsule 0 Taking  . ondansetron (ZOFRAN ODT) 4 MG disintegrating tablet Take 1 tablet (4 mg total) by mouth every 8 (eight) hours as needed for nausea or vomiting. When phenergan or reglan don't work 20 tablet 0 Taking  . Prenatal Vit-Fe Fumarate-FA (PRENATAL MULTIVITAMIN) TABS tablet Take 1 tablet by mouth daily at 12 noon.   Taking  . promethazine (PHENERGAN) 25 MG suppository Place 1 suppository (25 mg total) rectally every 6 (six) hours as needed for nausea or vomiting. USE RECTALLY AT BEDTIME 12 each 3 Taking    Review of Systems  Constitutional: Negative.   HENT:  Negative.   Eyes: Negative.   Respiratory: Negative.   Gastrointestinal: Positive for abdominal pain.  Endocrine: Negative.   Genitourinary: Positive for pelvic pain (sharp, constant pain).  Musculoskeletal: Negative.   Skin: Negative.   Allergic/Immunologic: Negative.   Neurological: Negative.   Hematological: Negative.   Psychiatric/Behavioral: Negative.    Physical Exam   Blood pressure 117/89, pulse 94, temperature 98 F (36.7 C), temperature source Oral, resp. rate 18, last menstrual period 11/19/2017, SpO2 100 %, unknown if currently breastfeeding.  Physical Exam  Nursing note and vitals reviewed. Constitutional: She is oriented to person, place, and time. She appears well-developed and well-nourished.  HENT:  Head: Normocephalic and atraumatic.  Eyes: Pupils are equal, round, and reactive to light.  Neck: Normal range of motion.  Cardiovascular: Normal rate and regular rhythm.  Respiratory: Effort normal and breath sounds normal.  GI: Soft. Bowel sounds are normal.  Genitourinary:  Genitourinary Comments: Dilation: 3.5 Effacement (%): 60, 70 Cervical Position: Posterior Station: -3 Presentation: Vertex Exam by:: Sunday Corn CNM   Musculoskeletal: Normal range of motion.  Neurological: She is alert and oriented to person, place, and time.  Skin: Skin is warm and dry.  Psychiatric: She has a normal mood and affect. Her behavior is normal. Judgment and thought content normal.    MAU Course  Procedures  MDM CCUA Dialudid 1 mg IM -- resolved pain Phenergan 25 mg IM -- improved nausea NST - FHR: 140 bpm / moderate variability / accels present / decels absent / TOCO: none   Results for orders placed or performed during the hospital encounter of 07/24/18 (from the past 48 hour(s))  Urinalysis, Routine w reflex microscopic     Status: Abnormal   Collection Time: 07/24/18 11:44 AM  Result Value Ref Range   Color, Urine YELLOW YELLOW   APPearance CLEAR CLEAR    Specific Gravity, Urine 1.024 1.005 - 1.030   pH 6.0 5.0 - 8.0   Glucose, UA NEGATIVE NEGATIVE mg/dL   Hgb urine dipstick NEGATIVE NEGATIVE   Bilirubin Urine NEGATIVE NEGATIVE   Ketones, ur 80 (A) NEGATIVE mg/dL   Protein, ur 30 (A) NEGATIVE mg/dL   Nitrite NEGATIVE NEGATIVE   Leukocytes, UA NEGATIVE NEGATIVE   RBC / HPF 0-5 0 - 5 RBC/hpf   WBC, UA 0-5 0 - 5 WBC/hpf   Bacteria, UA NONE SEEN NONE SEEN   Squamous Epithelial / LPF 0-5 0 - 5   Mucus PRESENT     Comment: Performed at [facility], [address], [place], [place] [address]     Assessment and Plan  Abdominal pain during pregnancy, third trimester  - Discussed pain being caused by uterine fibroids - Labor precautions given - Information provided on abd pain in pregnancy and fibroids   Hyperemesis affecting pregnancy, antepartum  - Continue anti-emetic meds as prescribed - Discharge patient - Keep scheduled appts - Patient verbalized an understanding of the plan of care and agrees.     Laury Deep, MSN, CNM 07/24/2018, 10:54 AM

## 2018-07-24 NOTE — MAU Note (Signed)
Rn confirmed with patient that she had a ride home . States she will call and arrange ride while waiting for medication to profile.

## 2018-07-25 ENCOUNTER — Encounter: Payer: Self-pay | Admitting: Family Medicine

## 2018-07-26 ENCOUNTER — Ambulatory Visit (INDEPENDENT_AMBULATORY_CARE_PROVIDER_SITE_OTHER): Payer: Self-pay | Admitting: Family Medicine

## 2018-07-26 ENCOUNTER — Other Ambulatory Visit: Payer: Self-pay

## 2018-07-26 ENCOUNTER — Encounter (HOSPITAL_COMMUNITY): Payer: Self-pay

## 2018-07-26 ENCOUNTER — Encounter (HOSPITAL_COMMUNITY): Payer: Self-pay | Admitting: *Deleted

## 2018-07-26 ENCOUNTER — Inpatient Hospital Stay (HOSPITAL_COMMUNITY)
Admission: AD | Admit: 2018-07-26 | Discharge: 2018-07-28 | DRG: 807 | Disposition: A | Discharge status: Home / Self Care | Payer: Medicaid Other | Attending: Family Medicine | Admitting: Family Medicine

## 2018-07-26 ENCOUNTER — Ambulatory Visit (HOSPITAL_COMMUNITY)
Admission: RE | Admit: 2018-07-26 | Discharge: 2018-07-26 | Disposition: A | Discharge status: Home / Self Care | Payer: Self-pay | Source: Ambulatory Visit | Attending: Family Medicine | Admitting: Family Medicine

## 2018-07-26 VITALS — BP 110/69 | HR 82 | Wt 119.4 lb

## 2018-07-26 DIAGNOSIS — O26893 Other specified pregnancy related conditions, third trimester: Secondary | ICD-10-CM

## 2018-07-26 DIAGNOSIS — R109 Unspecified abdominal pain: Secondary | ICD-10-CM

## 2018-07-26 DIAGNOSIS — O99824 Streptococcus B carrier state complicating childbirth: Secondary | ICD-10-CM | POA: Diagnosis present

## 2018-07-26 DIAGNOSIS — Z3483 Encounter for supervision of other normal pregnancy, third trimester: Secondary | ICD-10-CM | POA: Diagnosis present

## 2018-07-26 DIAGNOSIS — Z3480 Encounter for supervision of other normal pregnancy, unspecified trimester: Secondary | ICD-10-CM

## 2018-07-26 DIAGNOSIS — O479 False labor, unspecified: Secondary | ICD-10-CM | POA: Diagnosis present

## 2018-07-26 DIAGNOSIS — Z789 Other specified health status: Secondary | ICD-10-CM | POA: Diagnosis present

## 2018-07-26 DIAGNOSIS — O0932 Supervision of pregnancy with insufficient antenatal care, second trimester: Secondary | ICD-10-CM

## 2018-07-26 DIAGNOSIS — O09522 Supervision of elderly multigravida, second trimester: Secondary | ICD-10-CM | POA: Diagnosis present

## 2018-07-26 DIAGNOSIS — O99820 Streptococcus B carrier state complicating pregnancy: Secondary | ICD-10-CM

## 2018-07-26 DIAGNOSIS — D259 Leiomyoma of uterus, unspecified: Secondary | ICD-10-CM

## 2018-07-26 DIAGNOSIS — Z3A38 38 weeks gestation of pregnancy: Secondary | ICD-10-CM

## 2018-07-26 DIAGNOSIS — O3413 Maternal care for benign tumor of corpus uteri, third trimester: Secondary | ICD-10-CM

## 2018-07-26 DIAGNOSIS — O26843 Uterine size-date discrepancy, third trimester: Secondary | ICD-10-CM

## 2018-07-26 LAB — CBC
HCT: 37.5 % (ref 36.0–46.0)
HEMOGLOBIN: 11.9 g/dL — AB (ref 12.0–15.0)
MCH: 25.6 pg — AB (ref 26.0–34.0)
MCHC: 31.7 g/dL (ref 30.0–36.0)
MCV: 80.8 fL (ref 80.0–100.0)
PLATELETS: 318 10*3/uL (ref 150–400)
RBC: 4.64 MIL/uL (ref 3.87–5.11)
RDW: 14.5 % (ref 11.5–15.5)
WBC: 9.8 10*3/uL (ref 4.0–10.5)
nRBC: 0 % (ref 0.0–0.2)

## 2018-07-26 LAB — TYPE AND SCREEN
ABO/RH(D): B POS
Antibody Screen: NEGATIVE

## 2018-07-26 MED ORDER — OXYTOCIN BOLUS FROM INFUSION
500.0000 mL | Freq: Once | INTRAVENOUS | Status: AC
  Administered 2018-07-26: 500 mL via INTRAVENOUS

## 2018-07-26 MED ORDER — SOD CITRATE-CITRIC ACID 500-334 MG/5ML PO SOLN
30.0000 mL | ORAL | Status: DC | PRN
  Administered 2018-07-26: 30 mL via ORAL
  Filled 2018-07-26: qty 15

## 2018-07-26 MED ORDER — ONDANSETRON HCL 4 MG/2ML IJ SOLN
4.0000 mg | Freq: Four times a day (QID) | INTRAMUSCULAR | Status: DC | PRN
  Administered 2018-07-26: 4 mg via INTRAVENOUS
  Filled 2018-07-26: qty 2

## 2018-07-26 MED ORDER — FLEET ENEMA 7-19 GM/118ML RE ENEM: 1.0000 | ENEMA | RECTAL | Status: DC | PRN

## 2018-07-26 MED ORDER — FENTANYL CITRATE (PF) 100 MCG/2ML IJ SOLN
100.0000 ug | INTRAMUSCULAR | Status: DC | PRN
  Administered 2018-07-26 (×2): 100 ug via INTRAVENOUS
  Filled 2018-07-26 (×2): qty 2

## 2018-07-26 MED ORDER — LACTATED RINGERS IV SOLN: 500.0000 mL | INTRAVENOUS | Status: DC | PRN

## 2018-07-26 MED ORDER — LIDOCAINE HCL (PF) 1 % IJ SOLN
30.0000 mL | INTRAMUSCULAR | Status: DC | PRN
  Administered 2018-07-26: 30 mL via SUBCUTANEOUS
  Filled 2018-07-26: qty 30

## 2018-07-26 MED ORDER — OXYTOCIN 40 UNITS IN LACTATED RINGERS INFUSION - SIMPLE MED
2.5000 [IU]/h | INTRAVENOUS | Status: DC
  Filled 2018-07-26: qty 1000

## 2018-07-26 MED ORDER — PENICILLIN G 3 MILLION UNITS IVPB - SIMPLE MED
3.0000 10*6.[IU] | INTRAVENOUS | Status: DC
  Administered 2018-07-26: 3 10*6.[IU] via INTRAVENOUS
  Filled 2018-07-26 (×3): qty 100

## 2018-07-26 MED ORDER — LACTATED RINGERS IV SOLN
INTRAVENOUS | Status: DC
  Administered 2018-07-26: 15:00:00 via INTRAVENOUS

## 2018-07-26 MED ORDER — SODIUM CHLORIDE 0.9 % IV SOLN
5.0000 10*6.[IU] | Freq: Once | INTRAVENOUS | Status: AC
  Administered 2018-07-26: 5 10*6.[IU] via INTRAVENOUS
  Filled 2018-07-26: qty 5

## 2018-07-26 NOTE — Anesthesia Pain Management Evaluation Note (Signed)
  CRNA Pain Management Visit Note  Patient: Gabriela Morgan, 37 y.o., female  "Hello I am a member of the anesthesia team at [facility]. We have an anesthesia team available at all times to provide care throughout the hospital, including epidural management and anesthesia for C-section. I don't know your plan for the delivery whether it a natural birth, water birth, IV sedation, nitrous supplementation, doula or epidural, but we want to meet your pain goals."   1.Was your pain managed to your expectations on prior hospitalizations?   Yes   2.What is your expectation for pain management during this hospitalization?     IV pain meds  3.How can we help you reach that goal? *support**  Record the patient's initial score and the patient's pain goal.   Pain: 4  Pain Goal: 8 [facility] wants you to be able to say your pain was always managed very well.  Sandrea Matte 07/26/2018

## 2018-07-26 NOTE — Progress Notes (Signed)
Spanish interpreter Viria at bedside for delivery. Explained recovery and made sure patient had no questions.  Juluis Mire, RN

## 2018-07-26 NOTE — MAU Note (Signed)
Sent up from clinic, ? Labor. Has been contracting all wk, getting  Closer and stronger.  White d/c. No water or bleeding.

## 2018-07-26 NOTE — H&P (Addendum)
OBSTETRIC ADMISSION HISTORY AND PHYSICAL  Gabriela Morgan is a 37 y.o. female [phone] with IUP at [redacted]w[redacted]d by 19 wk [place] presenting for contractions with cervical change in the MAU. Currently in early labor, contractions every 5-7 minutes but uncomfortable.   Reports fetal movement. Denies vaginal bleeding. No LOF. Denies complications this pregnancy other than hyperemesis for which she took Zofran. Has a history of preterm births at 27 and 20wks. Denies history of PPH during prior deliveries.   She received her prenatal care at [facility].  Support person in labor: brother, Braxton Feathers . Anatomy U/S: normal  Prenatal History/Complications: . Hyperemesis  . Hx of preterm delivery  . AMA  Past Medical History: Past Medical History:  Diagnosis Date  . Hyperemesis   . Preterm labor   . Uterine fibroid     Past Surgical History: Past Surgical History:  Procedure Laterality Date  . APPENDECTOMY  2015   in [place]    Obstetrical History: OB History    Gravida  4   Para  3   Term  1   Preterm  2   AB  0   Living  3     SAB  0   TAB  0   Ectopic  0   Multiple  0   Live Births  3        Obstetric Comments  2014 approx 28 weeks 2010 approx 30 weeks        Social History: Social History   Socioeconomic History  . Marital status: Single    Spouse name: Not on file  . Number of children: Not on file  . Years of education: Not on file  . Highest education level: Not on file  Occupational History  . Not on file  Social Needs  . Financial resource strain: Not on file  . Food insecurity:    Worry: Not on file    Inability: Not on file  . Transportation needs:    Medical: Not on file    Non-medical: Not on file  Tobacco Use  . Smoking status: Never Smoker  . Smokeless tobacco: Never Used  Substance and Sexual Activity  . Alcohol use: Not Currently  . Drug use: Not Currently  . Sexual activity: Not Currently    Birth control/protection: None   Lifestyle  . Physical activity:    Days per week: Not on file    Minutes per session: Not on file  . Stress: Not on file  Relationships  . Social connections:    Talks on phone: Not on file    Gets together: Not on file    Attends religious service: Not on file    Active member of club or organization: Not on file    Attends meetings of clubs or organizations: Not on file    Relationship status: Not on file  Other Topics Concern  . Not on file  Social History Narrative  . Not on file    Family History: Family History  Problem Relation Age of Onset  . Diabetes Mother   . Diabetes Brother   . Healthy Father     Allergies: No Known Allergies  Medications Prior to Admission  Medication Sig Dispense Refill Last Dose  . acetaminophen (TYLENOL) 325 MG tablet Take 650 mg by mouth every 6 (six) hours as needed for moderate pain.   Taking  . NIFEdipine (PROCARDIA) 10 MG capsule Take 1 capsule (10 mg total) by mouth every 6 (six) hours  as needed. 30 capsule 0 Taking  . ondansetron (ZOFRAN ODT) 4 MG disintegrating tablet Take 1 tablet (4 mg total) by mouth every 8 (eight) hours as needed for nausea or vomiting. When phenergan or reglan don't work 20 tablet 0 Taking  . Prenatal Vit-Fe Fumarate-FA (PRENATAL MULTIVITAMIN) TABS tablet Take 1 tablet by mouth daily at 12 noon.   Taking  . promethazine (PHENERGAN) 25 MG suppository Place 1 suppository (25 mg total) rectally every 6 (six) hours as needed for nausea or vomiting. USE RECTALLY AT BEDTIME 12 each 3 Taking     Review of Systems  All systems reviewed and negative except as stated in HPI  Blood pressure 121/83, pulse 87, temperature 98.3 F (36.8 C), temperature source Oral, resp. rate 17, height 5\' 2"  (1.575 m), weight 54.2 kg, last menstrual period 11/19/2017, unknown if currently breastfeeding. General appearance: alert, cooperative and no distress Lungs: no respiratory distress Heart: regular rate  Abdomen: soft,  non-tender; gravid b Extremities: Homans sign is negative, no sign of DVT Presentation: cephalic Fetal monitoring: FHR baseline 150, no decels Uterine activity: contractions every 5-7 minutes Dilation: 5.5 Effacement (%): 50 Station: -2 Exam by:: Tamala Julian CNM  Prenatal labs: ABO, Rh: --/--/B POS, B POS Performed at [facility], [address], [place], [place] [address]  [phone]/22 1357) Antibody: NEG (08/22 1357) Rubella: 2.17 (10/28 1637) RPR: Non Reactive (09/06 1440)  HBsAg: Negative (10/28 1637)  HIV: Non Reactive (09/06 1440)  GBS: Positive (08/22 0000)  Glucola: not completed Genetic screening: none   Prenatal Transfer Tool  Maternal Diabetes: No Genetic Screening: Declined Maternal Ultrasounds/Referrals: Normal Fetal Ultrasounds or other Referrals:  None Maternal Substance Abuse:  No Significant Maternal Medications:  None Significant Maternal Lab Results: None  No results found for this or any previous visit (from the past 24 hour(s)).  Patient Active Problem List   Diagnosis Date Noted  . Uterine contractions during pregnancy 07/26/2018  . Indication for care in labor or delivery 07/26/2018  . Group B Streptococcus carrier, antepartum 05/15/2018  . Abdominal pain during pregnancy, third trimester 04/17/2018  . Supervision of other normal pregnancy, antepartum 03/29/2018  . Limited prenatal care in second trimester 03/29/2018  . Uterine fibroids affecting pregnancy in third trimester 03/29/2018  . Language barrier 03/29/2018  . Hx of preterm delivery, currently pregnant, second trimester 03/29/2018  . AMA (advanced maternal age) multigravida 37+, second trimester 03/29/2018  . Hyperemesis affecting pregnancy, antepartum 03/18/2018    Assessment/Plan:  Gabriela Morgan is a 37 y.o. L4T6256 at [redacted]w[redacted]d here for SOL.   Labor: Expectant management, will wait for second dose of penicillin to be given then consider AROM at that time  -- pain control: doesn't want  epidural  Fetal Wellbeing: EFW 5-6lbs by Leopold's. Cephalic by prior exam.  -- GBS (+) - Penicillin -- continuous fetal monitoring - Cat 1 strip currently   Postpartum Planning -- breast feeding -- doesn't want contraception at this time (husband is in [place]) -- Rh positive / received Tdap   Orlene Plum, MD Family Medicine Resident   Attestation: I have seen this patient and agree with the resident's documentation. I have seen and examined them separately, and we have discussed the plan of care.  Lambert Mody. Juleen China, DO OB/GYN Fellow

## 2018-07-26 NOTE — Progress Notes (Signed)
Spanish interpreter Viria at bedside to introduce self to patient, evaluate, administer pain medication, and answer any questions.  Juluis Mire, RN

## 2018-07-26 NOTE — Progress Notes (Signed)
OB/GYN Faculty Practice: Labor Progress Note  Subjective: Contractions have gotten stronger. Fentanyl was moderately helpful. Ate dinner but then threw it up. Has gotten Zofran.   Objective: BP 123/71   Pulse 78   Temp 98.1 F (36.7 C) (Oral)   Resp 20   Ht 5\' 2"  (1.575 m)   Wt 54.2 kg   LMP 11/19/2017 Comment: pt has not been seen yet for this pregnancy   BMI 21.86 kg/m  Gen: well appearing, no acute distress Dilation: 5.5 Effacement (%): 60 Cervical Position: Posterior Station: -2 Presentation: Vertex Exam by:: [place], rn  Assessment and Plan: 37 y.o. [id number] [redacted]w[redacted]d here for SOL  Labor: second dose of Penicillin in 1 hour, planning for AROM afterwards -- pain control: IV Fentanyl, does not want an epidural  Fetal Well-Being: EFW 5-6lbs by Leopold's. Cephalic by prior exam.  -- GBS (+) - Penicillin -- continuous fetal monitoring - Cat 1 strip currently, contractions every 5 minutes   Orlene Plum, MD Family Medicine Resident 6:40 PM

## 2018-07-26 NOTE — Progress Notes (Signed)
   PRENATAL VISIT NOTE  Subjective:  Gabriela Morgan is a 37 y.o. [phone] at [redacted]w[redacted]d being seen today for ongoing prenatal care.  She is currently monitored for the following issues for this high-risk pregnancy and has Hyperemesis affecting pregnancy, antepartum; Supervision of other normal pregnancy, antepartum; Limited prenatal care in second trimester; Uterine fibroids affecting pregnancy in third trimester; Language barrier; Hx of preterm delivery, currently pregnant, second trimester; AMA (advanced maternal age) multigravida 77+, second trimester; Abdominal pain during pregnancy, third trimester; Dehydration; and Group B Streptococcus carrier, antepartum on their problem list.  Patient reports contractions that have been ongoing every 5 minutes for the last 2 weeks. They have become more frequent and painful. Went to MAU 2 days ago and was dilated 4cm at the time. .  Contractions: Irregular. Vag. Bleeding: None.  Movement: Present. Denies leaking of fluid.   The following portions of the patient's history were reviewed and updated as appropriate: allergies, current medications, past family history, past medical history, past social history, past surgical history and problem list. Problem list updated.  Objective:   Vitals:   07/26/18 1156  BP: 110/69  Pulse: 82  Weight: 119 lb 6.4 oz (54.2 kg)    Fetal Status: Fetal Heart Rate (bpm): 164   Movement: Present     General:  Alert, oriented and cooperative. Patient is in no acute distress.  Skin: Skin is warm and dry. No rash noted.   Cardiovascular: Normal heart rate noted  Respiratory: Normal respiratory effort, no problems with respiration noted  Abdomen: Soft, gravid, appropriate for gestational age.  Pain/Pressure: Present     Pelvic: Cervical exam performed       Dilation: 4.5 Effacement (%): 50 Station: -2   Extremities: Normal range of motion.  Edema: None  Mental Status: Normal mood and affect. Normal behavior. Normal judgment  and thought content.   Assessment and Plan:  Pregnancy: [id number] at [redacted]w[redacted]d  1. Supervision of other normal pregnancy, antepartum - doing well today - GBS positive; discussed intrapartum abx   2. Irregular uterine contractions - sent to MAU for monitoring and possible admission given cervical change from last check   Term labor symptoms and general obstetric precautions including but not limited to vaginal bleeding, contractions, leaking of fluid and fetal movement were reviewed in detail with the patient. Please refer to After Visit Summary for other counseling recommendations.  No follow-ups on file.  Future Appointments  Date Time Provider [place]  07/26/2018 12:45 PM WH-MFC [place] 2 WH-MFCUS MFC-US    Aura Camps, MD

## 2018-07-27 LAB — RPR: RPR: NONREACTIVE

## 2018-07-27 MED ORDER — SENNOSIDES-DOCUSATE SODIUM 8.6-50 MG PO TABS
2.0000 | ORAL_TABLET | ORAL | Status: DC
  Administered 2018-07-27 (×2): 2 via ORAL
  Filled 2018-07-27 (×2): qty 2

## 2018-07-27 MED ORDER — PRENATAL MULTIVITAMIN CH
1.0000 | ORAL_TABLET | Freq: Every day | ORAL | Status: DC
  Administered 2018-07-27 – 2018-07-28 (×2): 1 via ORAL
  Filled 2018-07-27 (×2): qty 1

## 2018-07-27 MED ORDER — ACETAMINOPHEN 325 MG PO TABS
650.0000 mg | ORAL_TABLET | ORAL | Status: DC | PRN
  Administered 2018-07-28: 650 mg via ORAL
  Filled 2018-07-27 (×2): qty 2

## 2018-07-27 MED ORDER — MEASLES, MUMPS & RUBELLA VAC ~~LOC~~ INJ
0.5000 mL | INJECTION | Freq: Once | SUBCUTANEOUS | Status: DC
  Filled 2018-07-27: qty 0.5

## 2018-07-27 MED ORDER — ONDANSETRON HCL 4 MG PO TABS
4.0000 mg | ORAL_TABLET | ORAL | Status: DC | PRN
  Administered 2018-07-28: 4 mg via ORAL
  Filled 2018-07-27: qty 1

## 2018-07-27 MED ORDER — BENZOCAINE-MENTHOL 20-0.5 % EX AERO: 1.0000 "application " | INHALATION_SPRAY | CUTANEOUS | Status: DC | PRN

## 2018-07-27 MED ORDER — ONDANSETRON HCL 4 MG/2ML IJ SOLN: 4.0000 mg | INTRAMUSCULAR | Status: DC | PRN

## 2018-07-27 MED ORDER — DIBUCAINE 1 % RE OINT: 1.0000 "application " | TOPICAL_OINTMENT | RECTAL | Status: DC | PRN

## 2018-07-27 MED ORDER — COCONUT OIL OIL: 1.0000 "application " | TOPICAL_OIL | Status: DC | PRN

## 2018-07-27 MED ORDER — DIPHENHYDRAMINE HCL 25 MG PO CAPS: 25.0000 mg | ORAL_CAPSULE | Freq: Four times a day (QID) | ORAL | Status: DC | PRN

## 2018-07-27 MED ORDER — SIMETHICONE 80 MG PO CHEW: 80.0000 mg | CHEWABLE_TABLET | ORAL | Status: DC | PRN

## 2018-07-27 MED ORDER — IBUPROFEN 600 MG PO TABS
600.0000 mg | ORAL_TABLET | Freq: Four times a day (QID) | ORAL | Status: DC
  Administered 2018-07-27 – 2018-07-28 (×7): 600 mg via ORAL
  Filled 2018-07-27 (×7): qty 1

## 2018-07-27 MED ORDER — WITCH HAZEL-GLYCERIN EX PADS: 1.0000 "application " | MEDICATED_PAD | CUTANEOUS | Status: DC | PRN

## 2018-07-27 MED ORDER — TETANUS-DIPHTH-ACELL PERTUSSIS 5-2.5-18.5 LF-MCG/0.5 IM SUSP: 0.5000 mL | Freq: Once | INTRAMUSCULAR | Status: DC

## 2018-07-27 NOTE — Progress Notes (Signed)
Parent request formula to supplement breast feeding due to Mother's choice. Parents have been informed of small tummy size of newborn, taught hand expression and understands the possible consequences of formula to the health of the infant. The possible consequences shared with patent include 1) Loss of confidence in breastfeeding 2) Engorgement 3) Allergic sensitization of baby(asthema/allergies) and 4) decreased milk supply for mother. After discussion of the above the mother decided to breast and bottle feed.The  tool used to give formula supplement will be bottles.

## 2018-07-27 NOTE — Progress Notes (Signed)
POSTPARTUM PROGRESS NOTE  Post Partum Day 1  Subjective:  Gabriela Morgan is a 37 y.o. [id number] s/p SVD at [redacted]w[redacted]d. She reports she is doing well. No acute events overnight. She denies any problems with ambulating or po intake. No BM but passing gas. Denies nausea or vomiting.  Pain is well controlled.  Lochia continues to be moderate to heavy. Denies dizziness or lightheadedness. No calf pain or SOB.  Objective: Blood pressure 125/82, pulse 68, temperature 98.4 F (36.9 C), temperature source Oral, resp. rate 20, height 5\' 2"  (1.575 m), weight 54.2 kg, last menstrual period 11/19/2017, SpO2 100 %, unknown if currently breastfeeding.  Physical Exam:  General: alert, cooperative and no distress Chest: no respiratory distress Heart:regular rate, distal pulses intact Abdomen: soft, nontender,  Uterine Fundus: firm, appropriately tender DVT Evaluation: No calf swelling or tenderness Extremities: no LE edema Skin: warm, dry  Recent Labs    07/26/18 1530  HGB 11.9*  HCT 37.5    Assessment/Plan: Gabriela Morgan is a 37 y.o. [id number] s/p SVD at [redacted]w[redacted]d.  PPD#1 - Doing well  Routine postpartum care Lochia: Moderate to heavy; Hgb stable, patient asymptomatic. Monitor for appropriate decrease tomorrow and absence of hypovolemic symptoms. Contraception: Tubal ligation, interested in having done as inpatient Feeding: Breast and bottle Dispo: Plan for discharge tomorrow.   LOS: 1 day   Hyacinth Meeker MS3 07/27/2018, 7:29 AM   I spoke with and examined patient and agree with resident/PA/SNM's note and plan of care.  Eating, drinking, voiding, ambulating well.  +flatus.  Lochia and pain wnl.  Denies dizziness, lightheadedness, or sob. No complaints.  VSS FF u-2 No evidence of DVT Lochia wnl Plan d/c tomorrow Breastfeeding  Plans IUD Roma Schanz, CNM, [facility] 07/28/2018 9:53 AM

## 2018-07-28 MED ORDER — ACETAMINOPHEN 500 MG PO TABS: 1000.0000 mg | ORAL_TABLET | Freq: Four times a day (QID) | ORAL | 0 refills | Status: DC | PRN

## 2018-07-28 MED ORDER — IBUPROFEN 600 MG PO TABS: 600.0000 mg | ORAL_TABLET | Freq: Four times a day (QID) | ORAL | 0 refills | Status: DC

## 2018-07-28 NOTE — Lactation Note (Signed)
This note was copied from a baby's chart. Lactation Consultation Note [place] interpreter [phone] [facility] used for consult Experienced BF mom BF her 1st child for 2 yrs, didn't BF 2nd child d/t illness, BF 3rd child for 6 months. Mom plans on breast/formula feeding d/t returning to work. Mom is offering breast then supplementing. Assessed breast. Mom has everted nipples. Denies soreness. Demonstrated hand expression. Rt. Breast that baby just BF on before receiving formula was Significantly softer than the Lt. Breast. Mom states she does give breast first and she alternates breast w/feedings.  Newborn behavior reviewed, encouraged no blankets and doing STS while BF, I&O, and pumping. Stressed importance of feeding baby d/t less than 6lbs. Feeding on cues, if not cued stimulate baby every 3 hrs. Mom states she has no further questions. Feels that BF going well w/o difficulty.  Mom aware of breast filling, and engorgement. Mom has [place]. [place] brochure given w/resources, support groups and [place] services. Patient Name: Gabriela Morgan [id number] Date: 07/28/2018 Reason for consult: Initial assessment;Infant < 6lbs   Maternal Data Has patient been taught Hand Expression?: Yes Does the patient have breastfeeding experience prior to this delivery?: Yes  Feeding Feeding Type: Formula Nipple Type: Slow - flow  LATCH Score       Type of Nipple: Everted at rest and after stimulation  Comfort (Breast/Nipple): Soft / non-tender        Interventions Interventions: Breast feeding basics reviewed;Hand express;Breast compression  Lactation Tools Discussed/Used WIC Program: Yes   Consult Status Consult Status: Complete Date: 07/28/18    Theodoro Kalata 07/28/2018, 12:49 AM

## 2018-07-28 NOTE — Discharge Summary (Signed)
Postpartum Discharge Summary     Patient Name: Gabriela Morgan DOB: [birth year] MRN: [id number]  Date of admission: 07/26/2018 Delivering Provider: Glenice Bow   Date of discharge: 07/28/2018  Admitting diagnosis: 38.4 labor eval Intrauterine pregnancy: [redacted]w[redacted]d     Secondary diagnosis:  Principal Problem:   Uterine contractions during pregnancy Active Problems:   Limited prenatal care in second trimester   Language barrier   AMA (advanced maternal age) multigravida 35+, second trimester   Abdominal pain during pregnancy, third trimester   Group B Streptococcus carrier, antepartum   Indication for care in labor or delivery  Additional problems: none     Discharge diagnosis: Term Pregnancy Delivered                                                                                                Post partum procedures:none  Augmentation: AROM  Complications: None  Hospital course:  Onset of Labor With Vaginal Delivery     37 y.o. yo [id number] at [redacted]w[redacted]d was admitted in Active Labor on 07/26/2018. Patient had an uncomplicated labor course as follows:  Membrane Rupture Time/Date: 8:41 PM ,07/26/2018   Intrapartum Procedures: Episiotomy: None [1]                                         Lacerations:  2nd degree [3];Perineal [11]  Patient had a delivery of a Viable infant. 07/26/2018  Information for the patient's newborn:  Liana Crocker [[id number]]  Delivery Method: Vaginal, Spontaneous(Filed from Delivery Summary)    Pateint had an uncomplicated postpartum course.  She is ambulating, tolerating a regular diet, passing flatus, and urinating well. Patient is discharged home in stable condition on 07/28/18.   Magnesium Sulfate recieved: No BMZ received: No  Physical exam  Vitals:   07/27/18 0828 07/27/18 1326 07/27/18 2309 07/28/18 0524  BP: 118/77 106/66 110/77 122/85  Pulse: 75 75 76 68  Resp: 18 20 14 18   Temp: 98.3 F (36.8 C) 98.4 F (36.9 C) 98.1 F (36.7 C)  97.8 F (36.6 C)  TempSrc: Oral Oral Oral Oral  SpO2: 100% 99% 99%   Weight:      Height:       General: alert, cooperative and no distress Lochia: appropriate Uterine Fundus: firm Incision: N/A DVT Evaluation: No evidence of DVT seen on physical exam. Labs: Lab Results  Component Value Date   WBC 9.8 07/26/2018   HGB 11.9 (L) 07/26/2018   HCT 37.5 07/26/2018   MCV 80.8 07/26/2018   PLT 318 07/26/2018   CMP Latest Ref Rng & Units 06/09/2018  Glucose 70 - 99 mg/dL 80  BUN 6 - 20 mg/dL 5(L)  Creatinine 0.44 - 1.00 mg/dL 0.34(L)  Sodium 135 - 145 mmol/L 132(L)  Potassium 3.5 - 5.1 mmol/L 3.8  Chloride 98 - 111 mmol/L 99  CO2 22 - 32 mmol/L 21(L)  Calcium 8.9 - 10.3 mg/dL 8.4(L)  Total Protein 6.5 - 8.1 g/dL 6.0(L)  Total Bilirubin 0.3 - 1.2 mg/dL  0.8  Alkaline Phos 38 - 126 U/L 95  AST 15 - 41 U/L 11(L)  ALT 0 - 44 U/L 11    Discharge instruction: per After Visit Summary and "Baby and Me Booklet".  After visit meds:  Allergies as of 07/28/2018   No Known Allergies     Medication List    TAKE these medications   acetaminophen 500 MG tablet Commonly known as:  TYLENOL Take 2 tablets (1,000 mg total) by mouth every 6 (six) hours as needed (for pain scale < 4).   ibuprofen 600 MG tablet Commonly known as:  ADVIL,MOTRIN Take 1 tablet (600 mg total) by mouth every 6 (six) hours.   prenatal multivitamin Tabs tablet Take 1 tablet by mouth daily at 12 noon.       Diet: routine diet  Activity: Advance as tolerated. Pelvic rest for 6 weeks.   Outpatient follow up:4 weeks Follow up Appt: Future Appointments  Date Time Provider [place]  09/01/2018  9:15 AM Rasch, Artist Pais, NP [facility]   Follow up Visit:   Please schedule this patient for Postpartum visit in: 4 weeks with the following provider: Any provider For C/S patients schedule nurse incision check in weeks 2 weeks: no Low risk pregnancy complicated by: none Delivery mode:   SVD Anticipated Birth Control:  other/unsure PP Procedures needed: None  Schedule Integrated BH visit: no  Newborn Data: Live born female  Birth Weight: 5 lb 12.2 oz (2614 g) APGAR: 8, 9  Newborn Delivery   Birth date/time:  07/26/2018 22:31:00 Delivery type:  Vaginal, Spontaneous     Baby Feeding: Breast Disposition:home with mother   07/28/2018 Aura Camps, MD

## 2018-08-02 ENCOUNTER — Encounter: Payer: Self-pay | Admitting: Advanced Practice Midwife

## 2018-09-01 ENCOUNTER — Ambulatory Visit: Payer: Self-pay | Admitting: Obstetrics and Gynecology

## 2018-10-28 ENCOUNTER — Encounter (HOSPITAL_COMMUNITY): Payer: Self-pay

## 2019-04-01 IMAGING — US US MFM OB LIMITED
1 series · 14 of 28 positions shown · non-contrast
Comparison: none

[Series 1: us mfm ob limited · 14 of 30 slices shown]
[im 2/30]
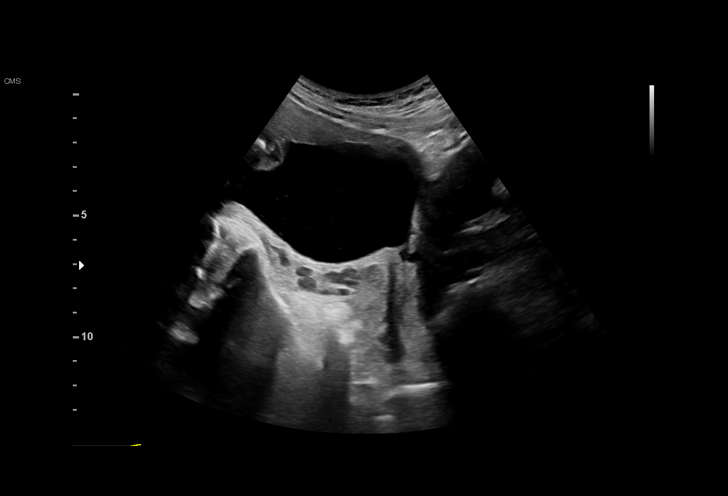
[im 4/30]
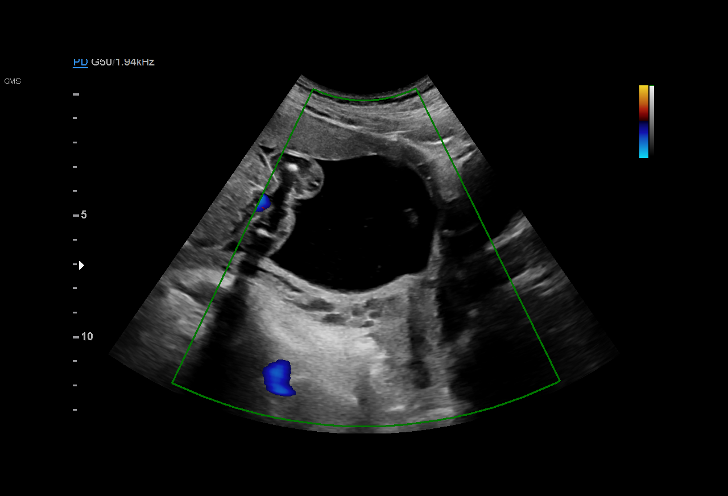
[im 6/30]
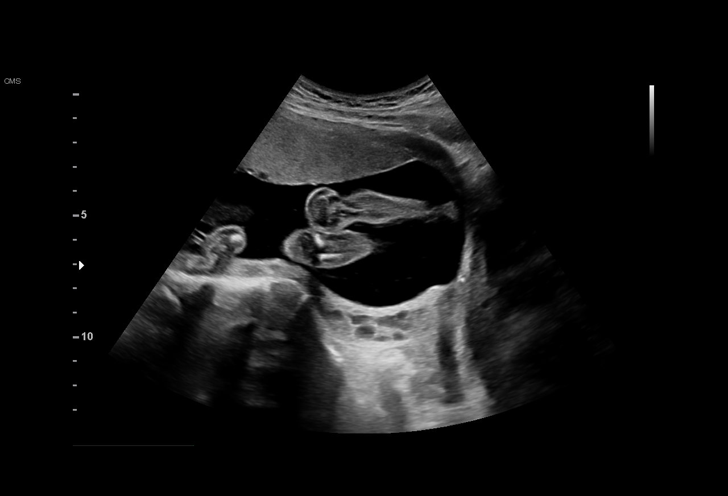
[im 8/30]
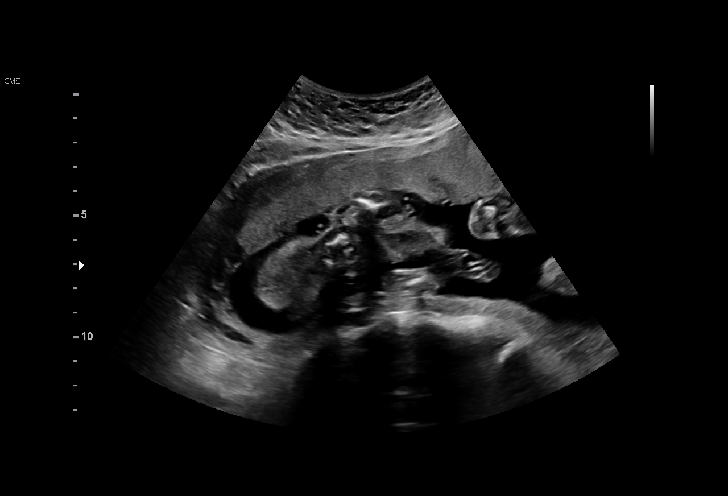
[im 10/30]
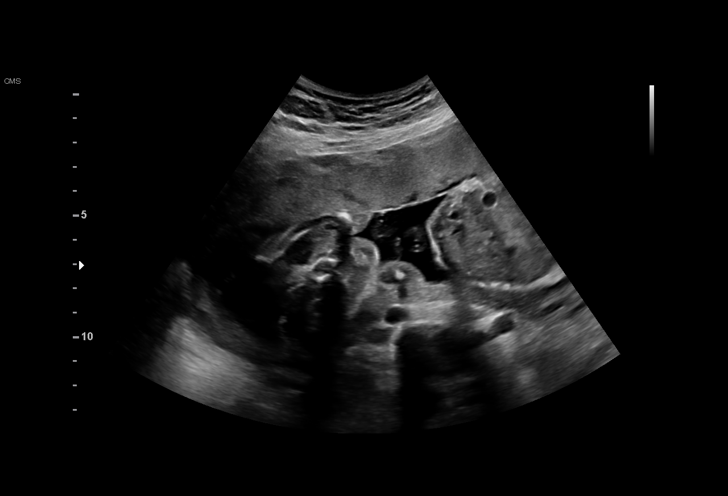
[im 12/30]
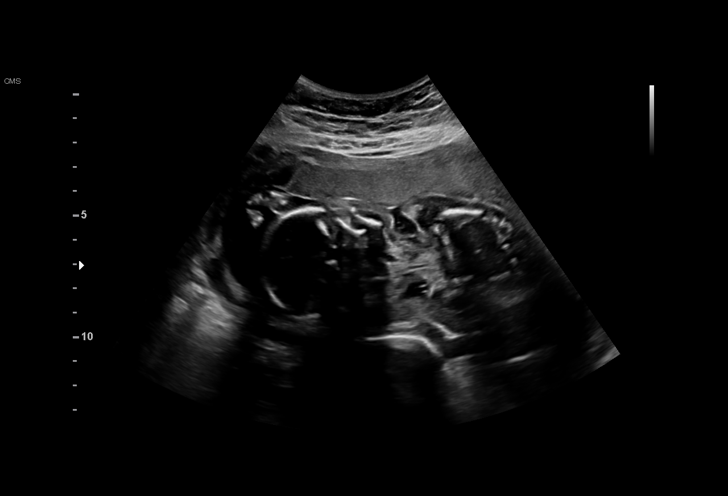
[im 14/30]
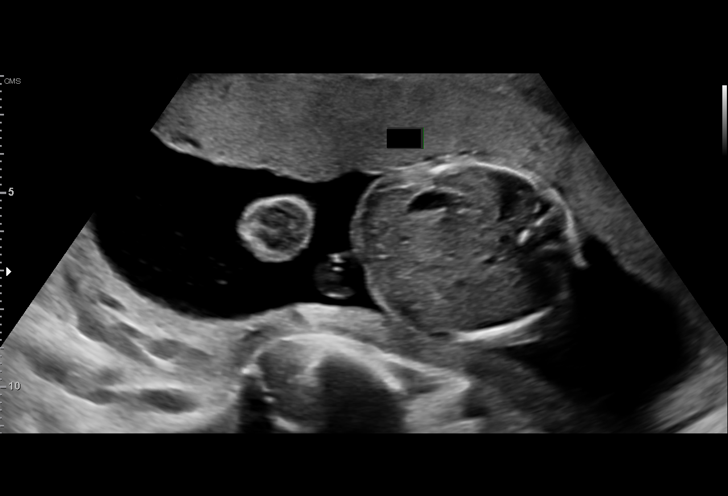
[im 17/30]
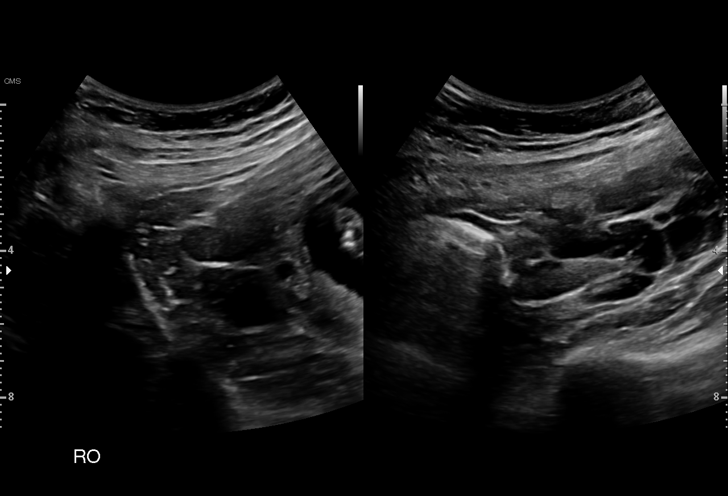
[im 19/30]
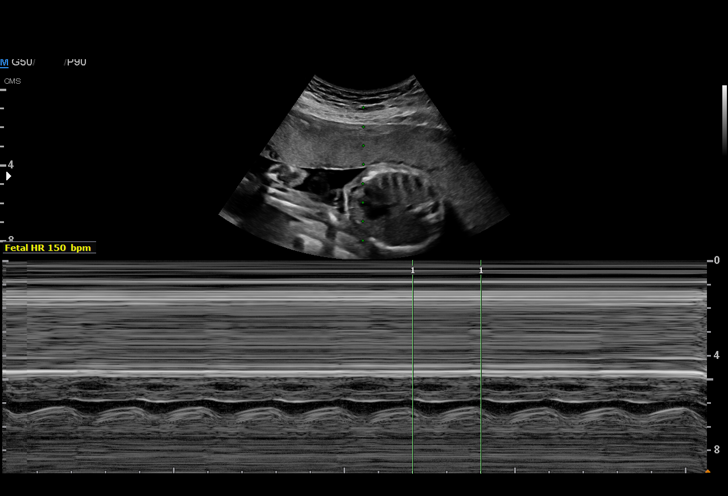
[im 21/30]
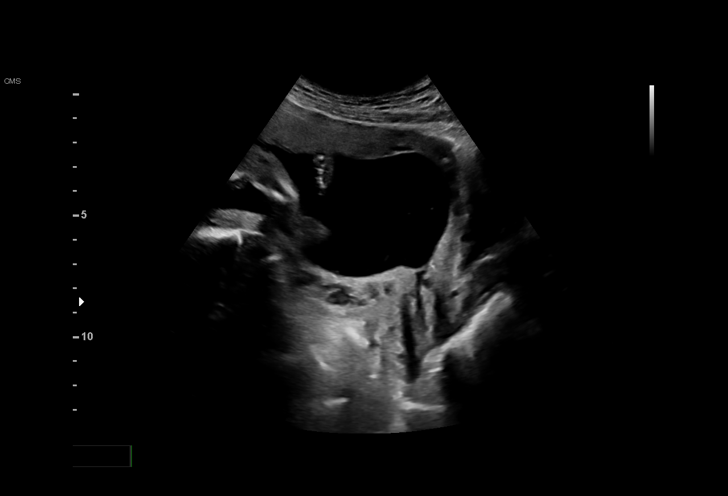
[im 23/30]
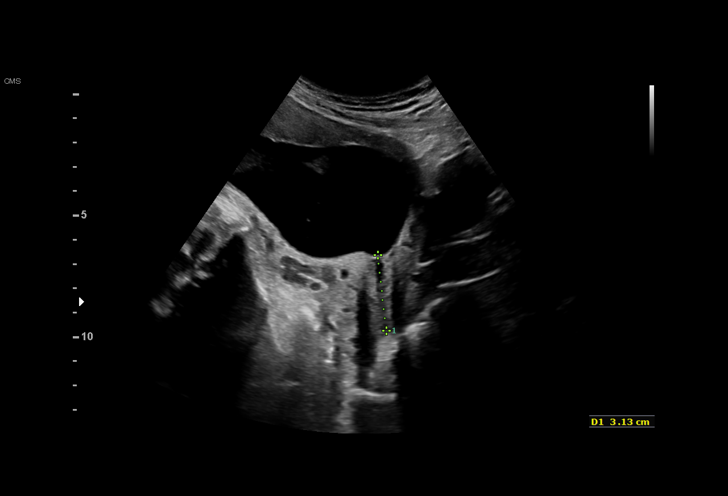
[im 25/30]
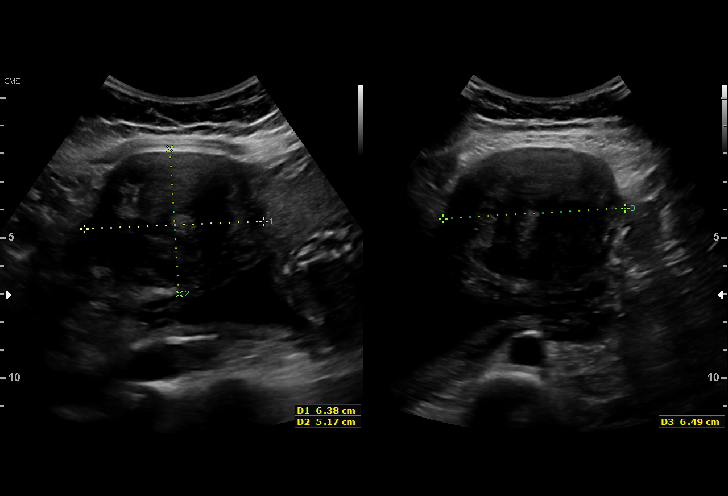
[im 27/30]
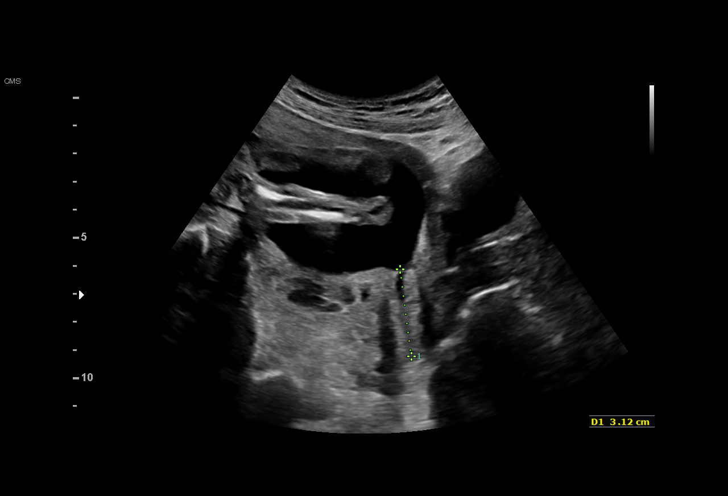
[im 30/30]
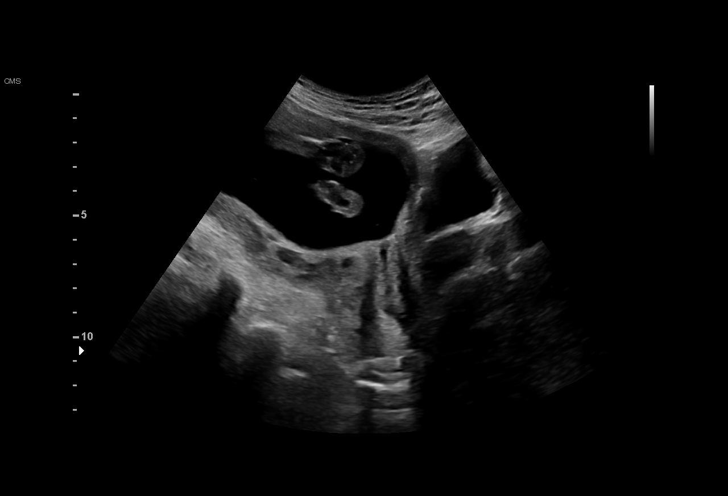

[14 of 28 positions shown; findings below may reference images not displayed]

1  YOSVANY SIDNEY           [id number]      [id number]     [id number]
Indications

21 weeks gestation of pregnancy
Late prenatal care, second trimester
Pelvic pain affecting pregnancy in second
trimester
Uterine fibroids affecting pregnancy in        O34.12,
second trimester, antepartum
Advanced maternal age multigravida 35+,
second trimester
Poor obstetric history: Previous preterm
delivery, antepartum x 2
OB History

Blood Type:            Height:  5'4"   Weight (lb):  125       BMI:
Gravidity:    4         Term:   2        Prem:   1        SAB:   0
TOP:          0       Ectopic:  0        Living: 3
Fetal Evaluation

Num Of Fetuses:     1
Fetal Heart         150
Rate(bpm):
Cardiac Activity:   Observed
Presentation:       Breech
Placenta:           Anterior, above cervical os
P. Cord Insertion:  Visualized

Amniotic Fluid
AFI FV:      Subjectively within normal limits

Largest Pocket(cm)
5.66
Gestational Age

LMP:           18w 4d        Date:  11/19/17                 EDD:   08/26/18
Best:          21w 4d     Det. By:  U/S  (03/17/18)          EDD:   08/05/18
Cervix Uterus Adnexa

Cervix
Length:            3.3  cm.
Normal appearance by transabdominal scan.

Uterus
Single fibroid noted, see table below.

Left Ovary
Within normal limits.

Right Ovary
Within normal limits.

Cul De Sac:   No free fluid seen.

Adnexa:       No abnormality visualized.
Myomas

Site                     L(cm)      W(cm)      D(cm)      Location
Anterior Left            6.5        6.4        5.2        Intramural

Blood Flow                 RI        PI       Comments

Impression

Patient is being evaluated for abdominal pain.

A limited ultrasound study was performed. Amniotic fluid is
normal and good fetal activity is seen. Placenta is anterior
and appears normal. On transabdominal scan, the cervix
measures 3.3 cm, which is normal. An anterior intramural
myoma is seen (see above for measurements).
Recommendations

Clinical correlation recommended.

## 2019-04-05 IMAGING — US US MFM OB TRANSVAGINAL
1 series · 10 of 10 positions shown · non-contrast
Comparison: none

[Series 1: us mfm ob transvaginal · 10 of 10 slices shown]
[im 1/10]
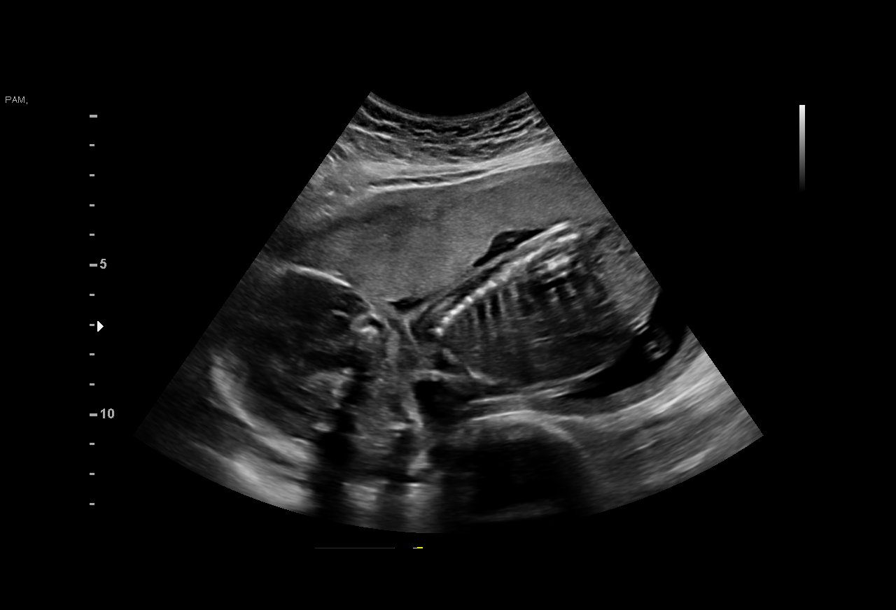
[im 2/10]
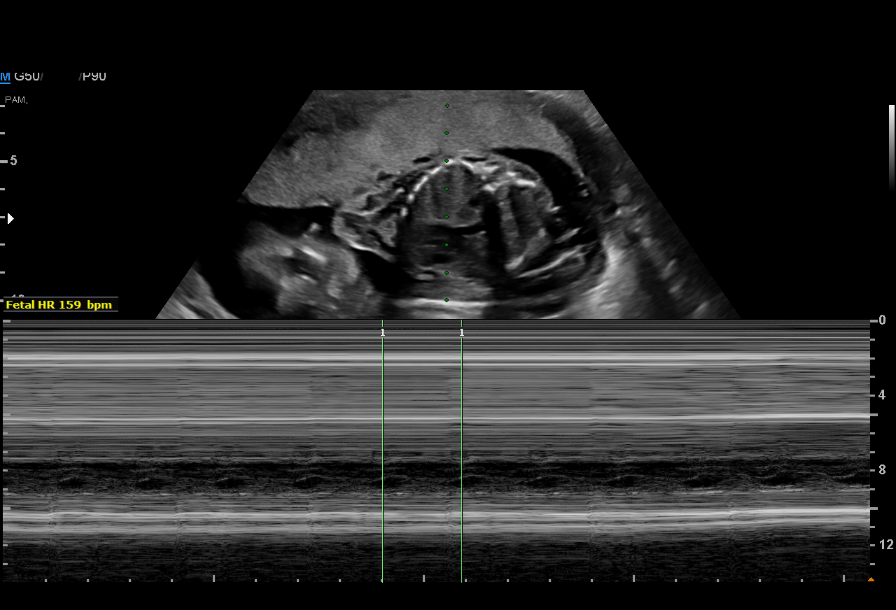
[im 3/10]
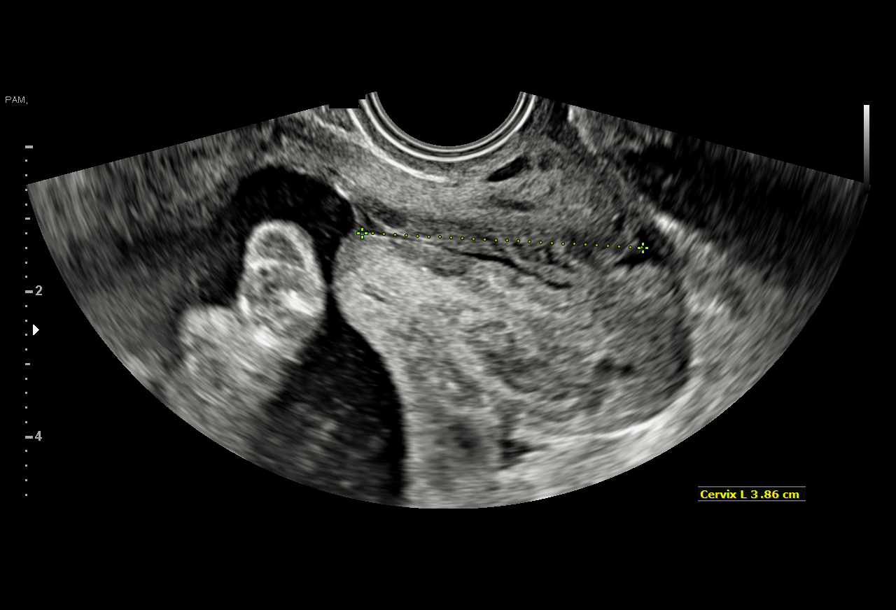
[im 4/10]
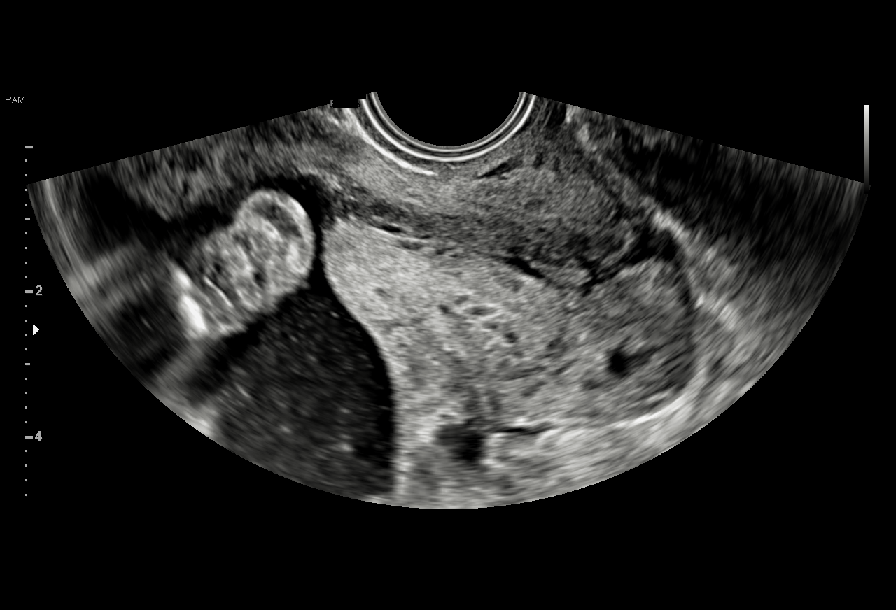
[im 5/10]
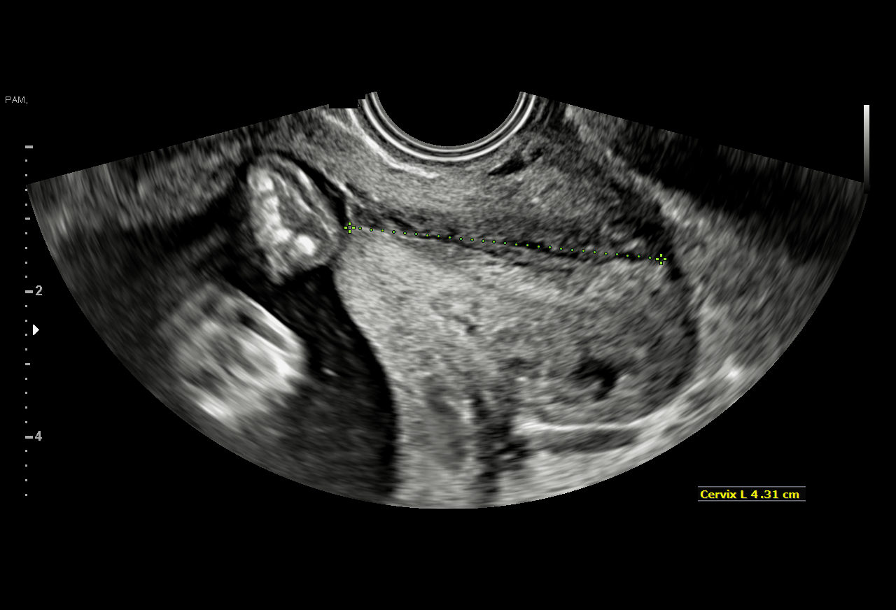
[im 6/10]
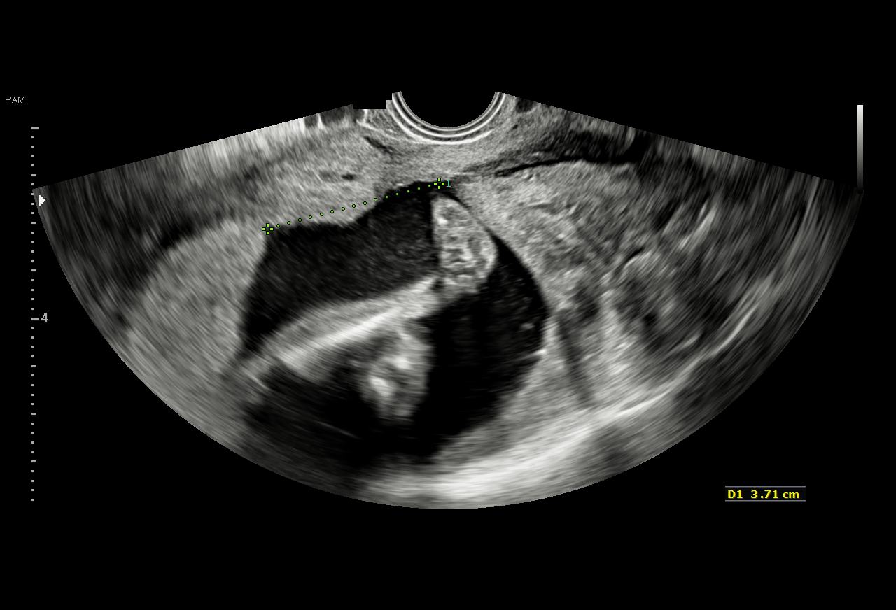
[im 7/10]
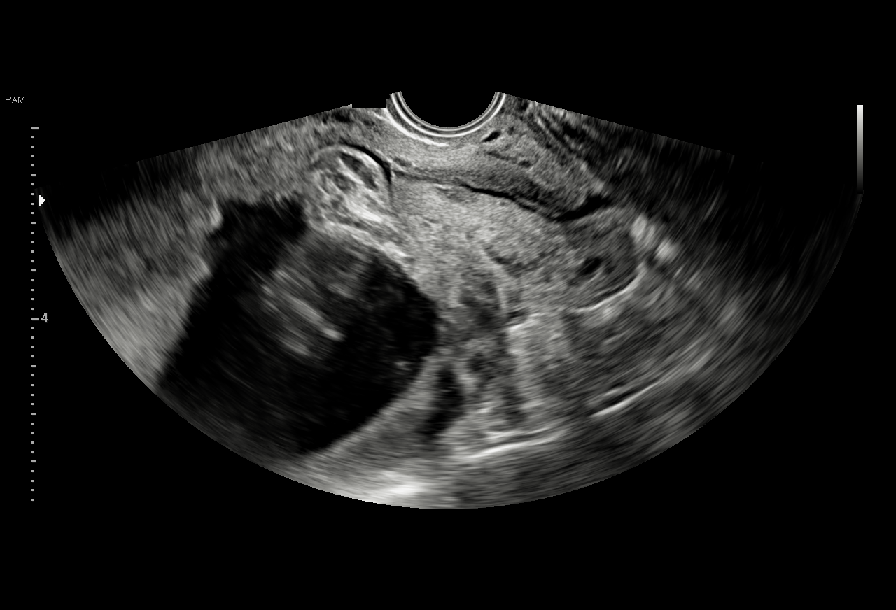
[im 8/10]
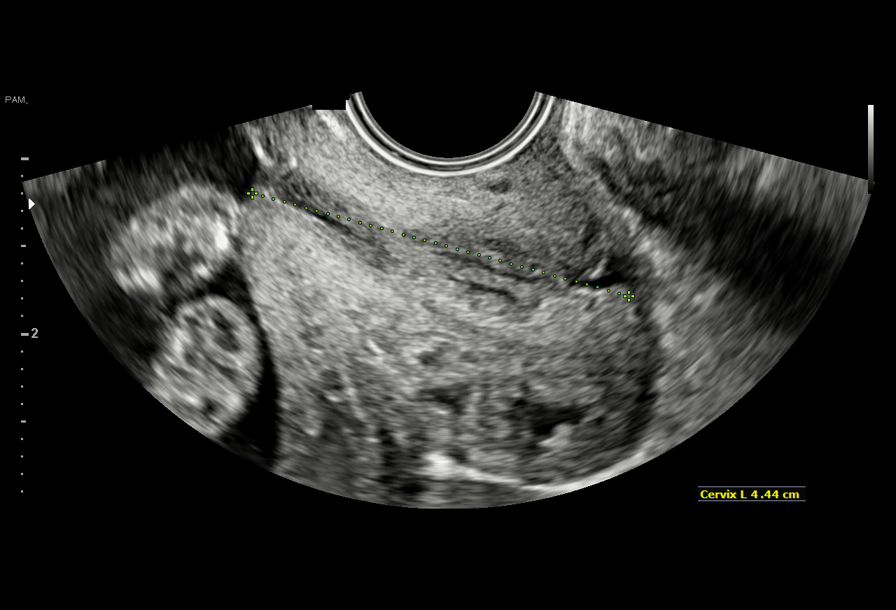
[im 9/10]
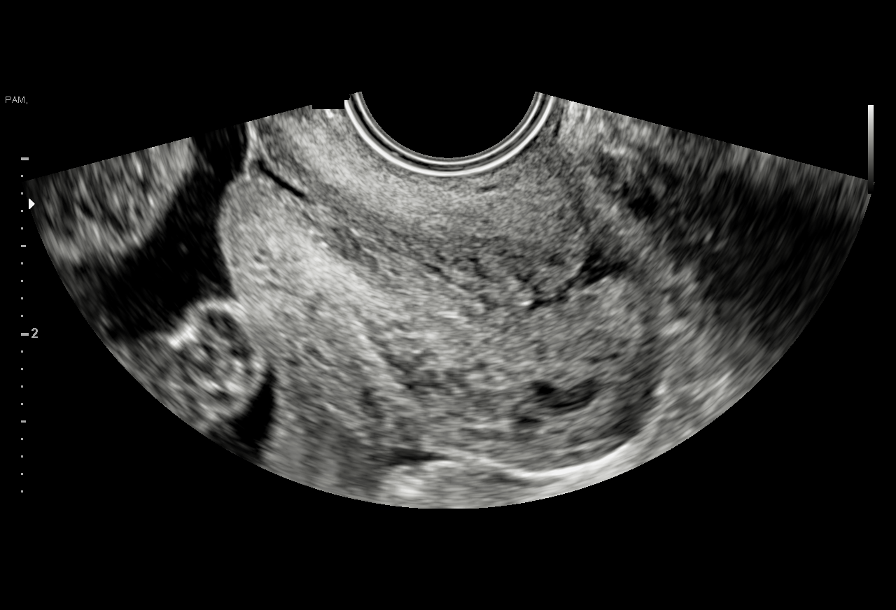
[im 10/10]
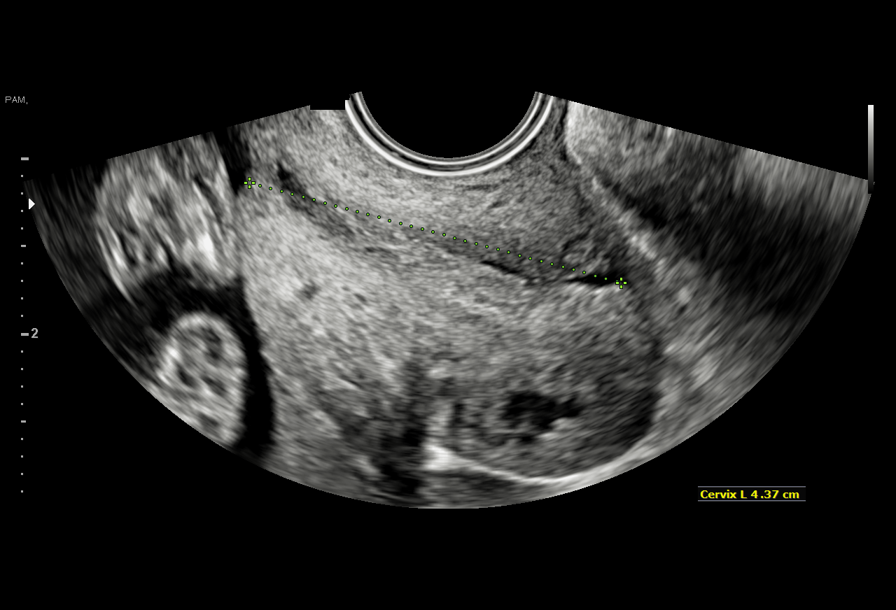

[10 of 10 positions shown; findings below may reference images not displayed]

Indications

22 weeks gestation of pregnancy
Late prenatal care, second trimester
Pelvic pain affecting pregnancy in second
trimester
Uterine fibroids affecting pregnancy in        O34.12,
second trimester, antepartum
Advanced maternal age multigravida 35+,
second trimester
Poor obstetric history: Previous preterm
delivery, antepartum x 2
OB History

Blood Type:            Height:  5'4"   Weight (lb):  125       BMI:
Gravidity:    4         Term:   2        Prem:   1        SAB:   0
TOP:          0       Ectopic:  0        Living: 3
Fetal Evaluation

Num Of Fetuses:     1
Fetal Heart         159
Rate(bpm):
Cardiac Activity:   Observed
Presentation:       Breech

Amniotic Fluid
AFI FV:      Subjectively within normal limits
Gestational Age

LMP:           19w 1d        Date:  11/19/17                 EDD:   08/26/18
Best:          22w 1d     Det. By:  U/S  (03/17/18)          EDD:   08/05/18
Cervix Uterus Adnexa
Cervix
Length:           4.37  cm.
Normal appearance by transvaginal scan
Impression

Amniotic fluid is normal and good fetal activity is seen.
On transvaginal scan, the cervix measures 4.3 cm, which is
normal.

## 2020-06-05 ENCOUNTER — Inpatient Hospital Stay (HOSPITAL_COMMUNITY): Payer: Self-pay

## 2020-06-05 ENCOUNTER — Inpatient Hospital Stay (HOSPITAL_COMMUNITY)
Admission: EM | Admit: 2020-06-05 | Discharge: 2020-06-05 | Disposition: A | Discharge status: Home / Self Care | Payer: Self-pay | Attending: Obstetrics and Gynecology | Admitting: Obstetrics and Gynecology

## 2020-06-05 ENCOUNTER — Other Ambulatory Visit: Payer: Self-pay

## 2020-06-05 ENCOUNTER — Encounter (HOSPITAL_COMMUNITY): Payer: Self-pay | Admitting: Obstetrics and Gynecology

## 2020-06-05 DIAGNOSIS — O209 Hemorrhage in early pregnancy, unspecified: Secondary | ICD-10-CM

## 2020-06-05 DIAGNOSIS — O4690 Antepartum hemorrhage, unspecified, unspecified trimester: Secondary | ICD-10-CM

## 2020-06-05 DIAGNOSIS — R102 Pelvic and perineal pain: Secondary | ICD-10-CM

## 2020-06-05 DIAGNOSIS — Z3A00 Weeks of gestation of pregnancy not specified: Secondary | ICD-10-CM | POA: Insufficient documentation

## 2020-06-05 DIAGNOSIS — O3680X0 Pregnancy with inconclusive fetal viability, not applicable or unspecified: Secondary | ICD-10-CM

## 2020-06-05 DIAGNOSIS — O26891 Other specified pregnancy related conditions, first trimester: Secondary | ICD-10-CM | POA: Insufficient documentation

## 2020-06-05 DIAGNOSIS — O4691 Antepartum hemorrhage, unspecified, first trimester: Secondary | ICD-10-CM | POA: Insufficient documentation

## 2020-06-05 HISTORY — DX: Anemia, unspecified: D64.9

## 2020-06-05 LAB — BASIC METABOLIC PANEL
Anion gap: 10 (ref 5–15)
BUN: 8 mg/dL (ref 6–20)
CO2: 22 mmol/L (ref 22–32)
Calcium: 8.6 mg/dL — ABNORMAL LOW (ref 8.9–10.3)
Chloride: 102 mmol/L (ref 98–111)
Creatinine, Ser: 0.48 mg/dL (ref 0.44–1.00)
GFR calc Af Amer: >60 mL/min (ref >60)
GFR calc non Af Amer: >60 mL/min (ref >60)
Glucose, Bld: 101 mg/dL — ABNORMAL HIGH (ref 70–99)
Potassium: 3.7 mmol/L (ref 3.5–5.1)
Sodium: 134 mmol/L — ABNORMAL LOW (ref 135–145)

## 2020-06-05 LAB — URINALYSIS, ROUTINE W REFLEX MICROSCOPIC
Bilirubin Urine: NEGATIVE
Glucose, UA: NEGATIVE mg/dL
Ketones, ur: NEGATIVE mg/dL
Leukocytes,Ua: NEGATIVE
Nitrite: NEGATIVE
Protein, ur: NEGATIVE mg/dL
RBC / HPF: >50 RBC/hpf — ABNORMAL HIGH (ref 0–5)
Specific Gravity, Urine: 1.025 (ref 1.005–1.030)
pH: 6 (ref 5.0–8.0)

## 2020-06-05 LAB — CBC WITH DIFFERENTIAL/PLATELET
Abs Immature Granulocytes: 0.05 10*3/uL (ref 0.00–0.07)
Basophils Absolute: 0 10*3/uL (ref 0.0–0.1)
Basophils Relative: 0 %
Eosinophils Absolute: 0.1 10*3/uL (ref 0.0–0.5)
Eosinophils Relative: 1 %
HCT: 36.4 % (ref 36.0–46.0)
Hemoglobin: 11 g/dL — ABNORMAL LOW (ref 12.0–15.0)
Immature Granulocytes: 1 %
Lymphocytes Relative: 18 %
Lymphs Abs: 1.8 10*3/uL (ref 0.7–4.0)
MCH: 23.4 pg — ABNORMAL LOW (ref 26.0–34.0)
MCHC: 30.2 g/dL (ref 30.0–36.0)
MCV: 77.3 fL — ABNORMAL LOW (ref 80.0–100.0)
Monocytes Absolute: 0.9 10*3/uL (ref 0.1–1.0)
Monocytes Relative: 9 %
Neutro Abs: 7.1 10*3/uL (ref 1.7–7.7)
Neutrophils Relative %: 71 %
Platelets: 284 10*3/uL (ref 150–400)
RBC: 4.71 MIL/uL (ref 3.87–5.11)
RDW: 14.8 % (ref 11.5–15.5)
WBC: 10 10*3/uL (ref 4.0–10.5)
nRBC: 0 % (ref 0.0–0.2)

## 2020-06-05 LAB — TYPE AND SCREEN
ABO/RH(D): B POS
Antibody Screen: NEGATIVE

## 2020-06-05 LAB — POCT PREGNANCY, URINE: Preg Test, Ur: POSITIVE — AB

## 2020-06-05 LAB — HCG, QUANTITATIVE, PREGNANCY: hCG, Beta Chain, Quant, S: 10728 m[IU]/mL — ABNORMAL HIGH (ref <5)

## 2020-06-05 NOTE — MAU Note (Signed)
Having a lot of vaginal bleeding since last wk. Some cramping in lower abd. LMP unknown. Hx irreg periods.

## 2020-06-05 NOTE — MAU Note (Signed)
Hansel Feinstein CNM in Triage to discuss all test results and d/c plan.

## 2020-06-05 NOTE — Discharge Instructions (Signed)
Amenaza de aborto Threatened Miscarriage  La amenaza de aborto se produce cuando una mujer tiene hemorragia vaginal durante las primeras 20semanas de [place], pero el embarazo no se interrumpe. Si durante este perodo usted tiene hemorragia vaginal, el mdico le har pruebas para asegurarse de que el embarazo contine. Si las pruebas muestran que usted contina embarazada y que el "beb" en desarrollo (feto) dentro del tero sigue creciendo, se considera que tuvo una [place] de aborto. La amenaza de aborto no implica que el embarazo vaya a Manufacturing engineer, pero s aumenta el riesgo de perder el embarazo (aborto completo). Cules son las causas? Por lo general, se desconoce la causa de esta afeccin. Si el resultado final es el aborto completo, la causa ms frecuente es la cantidad anormal de cromosomas del feto. Los cromosomas son las estructuras internas de las clulas que contienen todo el material gentico de Arts administrator. Qu incrementa el riesgo? Los siguientes factores del estilo de vida pueden aumentar el riesgo de aborto al comienzo del embarazo:  Fumar.  El consumo de cantidades excesivas de alcohol o cafena.  El consumo de drogas. Las siguientes enfermedades preexistentes pueden aumentar el riesgo de aborto al comienzo del embarazo:  Sndrome del ovario poliqustico.  Fibromas uterinos.  Infecciones.  Diabetes mellitus. Cules son los signos o los sntomas? Los sntomas de esta afeccin incluyen los siguientes:  Hemorragia vaginal.  Dolor o clicos abdominales leves. Cmo se diagnostica? Si tiene hemorragia con o sin dolor abdominal antes de las 20semanas de [place], el mdico le har pruebas para determinar si el embarazo contina. Estas incluirn lo siguiente:  Ecografa. Este estudio [place] ondas sonoras para crear imgenes del interior del tero. Esto permite que el mdico vea al beb en gestacin y otras estructuras, como la placenta.  Examen plvico. Este es un examen  interno de la vagina y del cuello uterino.  Medicin de la frecuencia cardaca del feto.  Pruebas de laboratorio, como anlisis de [place] o hisopados para Hydrographic surveyor una infeccin. Es posible que le diagnostiquen una amenaza de aborto en los siguientes casos:  La ecografa muestra que el embarazo contina.  La frecuencia cardaca del feto es alta.  El examen plvico muestra que la apertura entre el tero y la vagina (cuello uterino) est cerrada.  Los [employer] de sangre confirman que el embarazo contina. Cmo se trata? No se ha demostrado que ningn tratamiento evite que una amenaza de aborto se [place] en un aborto completo. Sin embargo, los cuidados [employer] hogar son importantes. Siga estas indicaciones en su casa:  Descanse lo suficiente.  No tenga relaciones sexuales ni use tampones si tiene hemorragia vaginal.  No se haga duchas vaginales.  No fume ni consuma drogas.  No beba alcohol.  Evite la cafena.  Vaya a todas las visitas de control prenatales y de control como se lo haya indicado el mdico. Esto es importante. Comunquese con un mdico si:  Tiene una ligera hemorragia o manchado vaginal durante el embarazo.  Tiene dolor o clicos en el abdomen.  Tiene fiebre. Solicite ayuda de inmediato si:  Tiene una hemorragia vaginal abundante.  Elimina cogulos de sangre por la vagina.  Elimina tejidos por la vagina.  Tiene una prdida de lquido o Optometrist lquido a chorros por Geneticist, molecular.  Siente dolor en la parte baja de la espalda o clicos abdominales intensos.  Tiene fiebre, escalofros y dolor abdominal intenso. Resumen  La amenaza de aborto se produce cuando una mujer tiene hemorragia  vaginal durante las primeras 20semanas de [place], pero el embarazo no se interrumpe.  Por lo general, no se conoce la causa de la amenaza de aborto.  Entre los sntomas de esta afeccin, se incluyen hemorragia vaginal y clicos o dolor  abdominal leve.  No se ha demostrado que ningn tratamiento evite que una amenaza de aborto se [place] en un aborto completo.  Vaya a todas las visitas de control prenatales y de control como se lo haya indicado el mdico. Esto es importante. Esta informacin no tiene Marine scientist el consejo del mdico. Asegrese de hacerle al mdico cualquier pregunta que tenga. Document Revised: 05/28/2017 Document Reviewed: 05/28/2017 Elsevier Patient Education  [place].

## 2020-06-05 NOTE — ED Triage Notes (Signed)
Emergency Medicine Provider OB Triage Evaluation Note  Gabriela Morgan is a 39 y.o. female, [phone], unknown if pregnant who presents to the emergency department with complaints of 2-week history of vaginal bleeding that has worsened over the past 2 days.  Reports lower abdominal cramping and pain as well.  Dizziness and lightheadedness.  Unsure if she could be pregnant..  Review of  Systems  Positive: Abdominal cramping and vaginal bleeding Negative: Vomiting  Physical Exam  BP 126/81 (BP Location: Left Arm)   Pulse 84   Temp 99.2 F (37.3 C) (Oral)   Resp 14   SpO2 100%  General: Awake, no distress  HEENT: Atraumatic  Resp: Normal effort  Cardiac: Normal rate Abd: Nondistended, nontender  MSK: Moves all extremities without difficulty Neuro: Speech clear  Medical Decision Making  Pt evaluated for pregnancy concern and is stable for transfer to MAU. Pt is in agreement with plan for transfer.  6:29 PM Discussed with MAU APP, Megan, who accepts patient in transfer.  Clinical Impression  No diagnosis found.     Delia Heady, PA-C 06/05/20 1830

## 2020-06-05 NOTE — Progress Notes (Signed)
Written and verbal d/c instructions given by Hansel Feinstein CNM using video interpreter and pt voiced understanding. Will f/u at [facility] on Fri am at 0900 for labs

## 2020-06-05 NOTE — MAU Note (Signed)
PT instructed in self swabs by [place] interpreter and pt voiced understanding.

## 2020-06-05 NOTE — ED Triage Notes (Signed)
Pt reports vaginal bleeding x 2 weeks, going through five or six pads per day. Endorses pain that feels like menstrual cramps. Endorses dizziness and feeling lightheaded. Pt reports irregular periods d/t hx of cysts.

## 2020-06-05 NOTE — MAU Provider Note (Signed)
Chief Complaint: Vaginal Bleeding  Provider saw patient at 2330      SUBJECTIVE HPI: Gabriela Morgan is a 39 y.o. L2G4010 at Unknown Gestational Age by LMP who presents to maternity admissions reporting increasing bloody discharge since last week  Has also had some cramping in pelvis.  She denies vaginal itching/burning, urinary symptoms, h/a, dizziness, n/v, or fever/chills.    Vaginal Bleeding The patient's primary symptoms include pelvic pain and vaginal bleeding. The patient's pertinent negatives include no genital itching, genital lesions or genital odor. This is a new problem. The current episode started in the past 7 days. The problem occurs constantly. The problem has been unchanged. The pain is mild. She is pregnant. Associated symptoms include abdominal pain. Pertinent negatives include no chills, constipation, diarrhea, fever, headaches, nausea or vomiting. The vaginal discharge was bloody. The vaginal bleeding is typical of menses. She has not been passing clots. She has not been passing tissue. Nothing aggravates the symptoms. She has tried nothing for the symptoms.   RN Note: Having a lot of vaginal bleeding since last wk. Some cramping in lower abd. LMP unknown. Hx irreg periods.   Past Medical History:  Diagnosis Date  . Anemia   . Hyperemesis   . Preterm labor   . Uterine fibroid    Past Surgical History:  Procedure Laterality Date  . APPENDECTOMY  2015   in [place]   Social History   Socioeconomic History  . Marital status: Single    Spouse name: Not on file  . Number of children: Not on file  . Years of education: Not on file  . Highest education level: Not on file  Occupational History  . Not on file  Tobacco Use  . Smoking status: Never Smoker  . Smokeless tobacco: Never Used  Vaping Use  . Vaping Use: Never used  Substance and Sexual Activity  . Alcohol use: Not Currently  . Drug use: Not Currently  . Sexual activity: Not Currently    Birth  control/protection: None  Other Topics Concern  . Not on file  Social History Narrative  . Not on file   Social Determinants of Health   Financial Resource Strain:   . Difficulty of Paying Living Expenses: Not on file  Food Insecurity:   . Worried About Charity fundraiser in the Last Year: Not on file  . Ran Out of Food in the Last Year: Not on file  Transportation Needs:   . Lack of Transportation (Medical): Not on file  . Lack of Transportation (Non-Medical): Not on file  Physical Activity:   . Days of Exercise per Week: Not on file  . Minutes of Exercise per Session: Not on file  Stress:   . Feeling of Stress : Not on file  Social Connections:   . Frequency of Communication with Friends and Family: Not on file  . Frequency of Social Gatherings with Friends and Family: Not on file  . Attends Religious Services: Not on file  . Active Member of Clubs or Organizations: Not on file  . Attends Archivist Meetings: Not on file  . Marital Status: Not on file  Intimate Partner Violence:   . Fear of Current or Ex-Partner: Not on file  . Emotionally Abused: Not on file  . Physically Abused: Not on file  . Sexually Abused: Not on file   No current facility-administered medications on file prior to encounter.   Current Outpatient Medications on File Prior to Encounter  Medication  Sig Dispense Refill  . acetaminophen (TYLENOL) 500 MG tablet Take 2 tablets (1,000 mg total) by mouth every 6 (six) hours as needed (for pain scale < 4). 60 tablet 0  . ibuprofen (ADVIL,MOTRIN) 600 MG tablet Take 1 tablet (600 mg total) by mouth every 6 (six) hours. 30 tablet 0  . Prenatal Vit-Fe Fumarate-FA (PRENATAL MULTIVITAMIN) TABS tablet Take 1 tablet by mouth daily at 12 noon.     No Known Allergies  I have reviewed patient's Past Medical Hx, Surgical Hx, Family Hx, Social Hx, medications and allergies.   ROS:  Review of Systems  Constitutional: Negative for chills and fever.   Gastrointestinal: Positive for abdominal pain. Negative for constipation, diarrhea, nausea and vomiting.  Genitourinary: Positive for pelvic pain and vaginal bleeding.  Neurological: Negative for headaches.   Review of Systems  Other systems negative   Physical Exam  Physical Exam Patient Vitals for the past 24 hrs:  BP Temp Temp src Pulse Resp SpO2 Height Weight  06/05/20 2315 114/68 98.8 F (37.1 C) -- 81 16 -- 5\' 3"  (1.6 m) 62.6 kg  06/05/20 1811 126/81 99.2 F (37.3 C) Oral 84 14 100 % -- --   Constitutional: Well-developed, well-nourished female in no acute distress.  Cardiovascular: normal rate Respiratory: normal effort GI: Abd soft, non-tender. Pos BS x 4 MS: Extremities nontender, no edema, normal ROM Neurologic: Alert and oriented x 4.  GU: Neg CVAT.  PELVIC EXAM: Cervix pink, visually closed, without lesion, moderate bloody discharge, vaginal walls and external genitalia normal Bimanual exam: Cervix 0/long/high, firm, anterior, neg CMT, uterus mildly tender, nonenlarged, adnexa without tenderness, enlargement, or mass   LAB RESULTS Results for orders placed or performed during the hospital encounter of 06/05/20 (from the past 24 hour(s))  Basic metabolic panel     Status: Abnormal   Collection Time: 06/05/20  7:05 PM  Result Value Ref Range   Sodium 134 (L) 135 - 145 mmol/L   Potassium 3.7 3.5 - 5.1 mmol/L   Chloride 102 98 - 111 mmol/L   CO2 22 22 - 32 mmol/L   Glucose, Bld 101 (H) 70 - 99 mg/dL   BUN 8 6 - 20 mg/dL   Creatinine, Ser 0.48 0.44 - 1.00 mg/dL   Calcium 8.6 (L) 8.9 - 10.3 mg/dL   GFR calc non Af Amer >60 >60 mL/min   GFR calc Af Amer >60 >60 mL/min   Anion gap 10 5 - 15  CBC with Differential     Status: Abnormal   Collection Time: 06/05/20  7:05 PM  Result Value Ref Range   WBC 10.0 4.0 - 10.5 K/uL   RBC 4.71 3.87 - 5.11 MIL/uL   Hemoglobin 11.0 (L) 12.0 - 15.0 g/dL   HCT 36.4 36 - 46 %   MCV 77.3 (L) 80.0 - 100.0 fL   MCH 23.4 (L) 26.0  - 34.0 pg   MCHC 30.2 30.0 - 36.0 g/dL   RDW 14.8 11.5 - 15.5 %   Platelets 284 150 - 400 K/uL   nRBC 0.0 0.0 - 0.2 %   Neutrophils Relative % 71 %   Neutro Abs 7.1 1.7 - 7.7 K/uL   Lymphocytes Relative 18 %   Lymphs Abs 1.8 0.7 - 4.0 K/uL   Monocytes Relative 9 %   Monocytes Absolute 0.9 0 - 1 K/uL   Eosinophils Relative 1 %   Eosinophils Absolute 0.1 0 - 0 K/uL   Basophils Relative 0 %   Basophils Absolute  0.0 0 - 0 K/uL   Immature Granulocytes 1 %   Abs Immature Granulocytes 0.05 0.00 - 0.07 K/uL  hCG, quantitative, pregnancy     Status: Abnormal   Collection Time: 06/05/20  7:05 PM  Result Value Ref Range   hCG, Beta Chain, Quant, S 10,728 (H) <5 mIU/mL  Type and screen [place]     Status: None   Collection Time: 06/05/20  7:14 PM  Result Value Ref Range   ABO/RH(D) B POS    Antibody Screen NEG    Sample Expiration      06/08/2020,2359 Performed at [facility], [place] [address], [place], [place] [address]   Pregnancy, urine POC     Status: Abnormal   Collection Time: 06/05/20  7:22 PM  Result Value Ref Range   Preg Test, Ur POSITIVE (A) NEGATIVE  Urinalysis, Routine w reflex microscopic Urine, Clean Catch     Status: Abnormal   Collection Time: 06/05/20  7:23 PM  Result Value Ref Range   Color, Urine YELLOW YELLOW   APPearance CLEAR CLEAR   Specific Gravity, Urine 1.025 1.005 - 1.030   pH 6.0 5.0 - 8.0   Glucose, UA NEGATIVE NEGATIVE mg/dL   Hgb urine dipstick LARGE (A) NEGATIVE   Bilirubin Urine NEGATIVE NEGATIVE   Ketones, ur NEGATIVE NEGATIVE mg/dL   Protein, ur NEGATIVE NEGATIVE mg/dL   Nitrite NEGATIVE NEGATIVE   Leukocytes,Ua NEGATIVE NEGATIVE   RBC / HPF >50 (H) 0 - 5 RBC/hpf   WBC, UA 0-5 0 - 5 WBC/hpf   Bacteria, UA RARE (A) NONE SEEN   Squamous Epithelial / LPF 6-10 0 - 5   Mucus PRESENT     --/--/B POS (09/15 1914)  IMAGING US OB LESS THAN 14 WEEKS WITH OB TRANSVAGINAL  Result Date: 06/05/2020 CLINICAL DATA:  Heavy  bleeding for 2 weeks, unknown LMP, quantitative hCG 10728 fall EXAM: OBSTETRIC <14 WK [place] AND TRANSVAGINAL OB US TECHNIQUE: Both transabdominal and transvaginal ultrasound examinations were performed for complete evaluation of the gestation as well as the maternal uterus, adnexal regions, and pelvic cul-de-sac. Transvaginal technique was performed to assess early pregnancy. COMPARISON:  Multiple prior gestational ultrasounds, most recent 05/12/2018 FINDINGS: Intrauterine gestational sac: None Yolk sac:  Not Visualized. Embryo:  Not Visualized. Cardiac Activity: Not Visualized. Maternal uterus/adnexae: Anteverted uterus. Heterogeneous fundal fibroid is again seen, measuring up to 3.6 x 2.3 x 3.5 cm, slightly diminished in size from comparison studies. Prominent vascular channels within the myometrium may reflect some decidual reaction. There is heterogeneous thickening of the endometrium to 2.4 cm at the level of the fundus without organized gestational sac. Right ovary measures 2.6 x 1.9 x 2.2 cm. No concerning right adnexal lesion. Left ovary measures 2.5 x 2.1 x 1.6 cm. Probable corpus luteum in the left ovary. IMPRESSION: No discernible intrauterine gestational sac is identified. Heterogeneous thickening at the level of the uterine fundus is nonspecific in the setting of a positive urine pregnancy test may reflect hemorrhagic products versus retained products of conception. Recommend close clinical evaluation and short-term interval follow-up ultrasound with serial quantitative beta HCG. Fibroid uterus. Electronically Signed   By: Lovena Le M.D.   On: 06/05/2020 22:36    MAU Management/MDM: Ordered usual first trimester r/o ectopic labs.   Pelvic exam and cultures done Will check baseline Ultrasound to rule out ectopic.  Consult Dr Rip Harbour. with presentation, exam findings, and results.  Discussed [place] and labs.  Even though HCG is  elevated with a negative [place] for Gestational sac, it is appropriate to repeat  HCG in 48 hours.  [place] may reflect hemorrhagic products s/p demise, so HCG may decrease.  If levels do not double, would likely recommend cytotec.  But since this is the first evaluation, he would not advise cytotec or MTX right now.   This bleeding/pain can represent a normal pregnancy with bleeding, spontaneous abortion or even an ectopic which can be life-threatening.  The process as listed above helps to determine which of these is present.    ASSESSMENT 1. Vaginal bleeding in pregnancy   2.     Pelvic pain in early pregnancy 3.     Pregnancy of unknown location 4.      Likely SAB in process vs missed abortion   PLAN Discharge home Plan to repeat HCG level in 48 hours in clinic Friday 9:00 am Will repeat  Ultrasound in about 7-10 days if HCG levels double appropriately  Ectopic precautions  Pt stable at time of discharge. Encouraged to return here or to other Urgent Care/ED if she develops worsening of symptoms, increase in pain, fever, or other concerning symptoms.    Hansel Feinstein CNM, MSN Certified Nurse-Midwife 06/05/2020  11:34 PM

## 2020-06-06 LAB — WET PREP, GENITAL
Clue Cells Wet Prep HPF POC: NONE SEEN
Sperm: NONE SEEN
Trich, Wet Prep: NONE SEEN
WBC, Wet Prep HPF POC: NONE SEEN
Yeast Wet Prep HPF POC: NONE SEEN

## 2020-06-07 ENCOUNTER — Ambulatory Visit (INDEPENDENT_AMBULATORY_CARE_PROVIDER_SITE_OTHER): Payer: Self-pay | Admitting: *Deleted

## 2020-06-07 ENCOUNTER — Other Ambulatory Visit: Payer: Self-pay

## 2020-06-07 ENCOUNTER — Encounter: Payer: Self-pay | Admitting: *Deleted

## 2020-06-07 VITALS — BP 113/61 | HR 74 | Ht 63.0 in | Wt 136.5 lb

## 2020-06-07 DIAGNOSIS — O26891 Other specified pregnancy related conditions, first trimester: Secondary | ICD-10-CM

## 2020-06-07 DIAGNOSIS — O4690 Antepartum hemorrhage, unspecified, unspecified trimester: Secondary | ICD-10-CM

## 2020-06-07 LAB — BETA HCG QUANT (REF LAB): hCG Quant: 3886 m[IU]/mL

## 2020-06-07 LAB — GC/CHLAMYDIA PROBE AMP (~~LOC~~) NOT AT ARMC
Chlamydia: NEGATIVE
Comment: NEGATIVE
Comment: NORMAL
Neisseria Gonorrhea: NEGATIVE

## 2020-06-07 NOTE — Progress Notes (Signed)
Video interpreter [place] used for encounter. Pt reports increased RLQ pain since visit to [facility] - pain scale 8. She has dark, heavy vaginal bleeding with blood clots - more than when seen @ [place]. Stat BHCG drawn and pt advised she will be notified of results later today. She voiced understanding.   Annetta results reviewed with Dr. Rosana Hoes who finds decrease in level consistent with miscarriage. Pt will need weekly non-stat BHCG until level is <5.   1235  I called pt with [place] interpreter [phone] and informed her of the results as well as recommended plan of care. During the conversation, pt was in a great deal of pain. She last took Tylenol @ 0600. I advised pt to take 2 tablets now and continue to take 2 tablets every 6 hrs if she is still having pain. She may also take ibuprofen 600 mg every 6 hrs as needed. If her pain becomes worse despite these medications, she should return to [facility] for evaluation. Pt will be called next week with appointment information for lab draw. She voiced understanding.

## 2020-06-10 NOTE — Progress Notes (Signed)
I have reviewed this chart and agree with the RN/CMA assessment and management.    K. Meryl Leidi Astle, M.D. Attending [facility] (Faculty Practice)   

## 2020-06-14 ENCOUNTER — Other Ambulatory Visit: Payer: Self-pay

## 2020-06-14 DIAGNOSIS — O039 Complete or unspecified spontaneous abortion without complication: Secondary | ICD-10-CM

## 2020-06-15 LAB — BETA HCG QUANT (REF LAB): hCG Quant: 228 m[IU]/mL

## 2020-06-19 ENCOUNTER — Telehealth: Payer: Self-pay | Admitting: *Deleted

## 2020-06-19 NOTE — Telephone Encounter (Addendum)
-----   Message from [name], MD sent at 06/19/2020  1:42 PM EDT ----- HCG decreasing c/w SAB. Please call and let patien know, repeat weekly until <5.  Thanks  1540 Called pt and informed her of test results showing appropriate decrease in pregnancy hormone level. We will need to check the level weekly until it is <5. Pt voiced understanding and agreed to appt on 10/1 @ 0900 for non-stat BHCG.

## 2020-06-21 ENCOUNTER — Other Ambulatory Visit: Payer: Self-pay

## 2020-06-21 DIAGNOSIS — O039 Complete or unspecified spontaneous abortion without complication: Secondary | ICD-10-CM

## 2020-06-22 LAB — BETA HCG QUANT (REF LAB): hCG Quant: 49 m[IU]/mL

## 2020-06-26 ENCOUNTER — Telehealth (INDEPENDENT_AMBULATORY_CARE_PROVIDER_SITE_OTHER): Payer: Self-pay | Admitting: Lactation Services

## 2020-06-26 DIAGNOSIS — O039 Complete or unspecified spontaneous abortion without complication: Secondary | ICD-10-CM

## 2020-06-26 NOTE — Telephone Encounter (Signed)
-----   Message from [name], MD sent at 06/26/2020  9:14 AM EDT ----- Repeat 1 week, decreasing HCG c/w SAB

## 2020-06-26 NOTE — Telephone Encounter (Signed)
Called patient with assistance of [employer], Spanish interpreter. She was informed that her Hcg levels are dropping and that she needs a follow up Hcg on Friday. Patient reports she can come in at 8:30 to have drawn. Message to front office to place on lab schedule.

## 2020-06-27 ENCOUNTER — Other Ambulatory Visit: Payer: Self-pay | Admitting: General Practice

## 2020-06-27 DIAGNOSIS — O039 Complete or unspecified spontaneous abortion without complication: Secondary | ICD-10-CM

## 2020-06-28 ENCOUNTER — Other Ambulatory Visit: Payer: Self-pay

## 2020-06-28 DIAGNOSIS — O039 Complete or unspecified spontaneous abortion without complication: Secondary | ICD-10-CM

## 2020-06-29 LAB — BETA HCG QUANT (REF LAB): hCG Quant: 4 m[IU]/mL

## 2020-07-02 ENCOUNTER — Telehealth: Payer: Self-pay

## 2020-07-02 NOTE — Telephone Encounter (Signed)
-----   Message from [name], MD sent at 07/02/2020 12:59 PM EDT ----- Neg HCG, SAB complete, no need for further repeat HCG

## 2020-07-02 NOTE — Telephone Encounter (Signed)
Called Pt using [place] id# [phone], to advise of HCG levels & that everything is complete & that no more blood work is needed. Pt verbalized understanding.

## 2022-01-19 ENCOUNTER — Inpatient Hospital Stay (HOSPITAL_COMMUNITY): Payer: Self-pay

## 2022-01-19 ENCOUNTER — Ambulatory Visit (INDEPENDENT_AMBULATORY_CARE_PROVIDER_SITE_OTHER): Payer: Self-pay

## 2022-01-19 ENCOUNTER — Other Ambulatory Visit: Payer: Self-pay

## 2022-01-19 ENCOUNTER — Inpatient Hospital Stay (HOSPITAL_COMMUNITY)
Admission: AD | Admit: 2022-01-19 | Discharge: 2022-01-19 | Disposition: A | Discharge status: Home / Self Care | Payer: Self-pay | Attending: Family Medicine | Admitting: Family Medicine

## 2022-01-19 ENCOUNTER — Encounter (HOSPITAL_COMMUNITY): Payer: Self-pay | Admitting: Obstetrics & Gynecology

## 2022-01-19 DIAGNOSIS — O09521 Supervision of elderly multigravida, first trimester: Secondary | ICD-10-CM | POA: Insufficient documentation

## 2022-01-19 DIAGNOSIS — Z3A01 Less than 8 weeks gestation of pregnancy: Secondary | ICD-10-CM | POA: Insufficient documentation

## 2022-01-19 DIAGNOSIS — Z3201 Encounter for pregnancy test, result positive: Secondary | ICD-10-CM | POA: Insufficient documentation

## 2022-01-19 DIAGNOSIS — O26891 Other specified pregnancy related conditions, first trimester: Secondary | ICD-10-CM | POA: Insufficient documentation

## 2022-01-19 DIAGNOSIS — O209 Hemorrhage in early pregnancy, unspecified: Secondary | ICD-10-CM

## 2022-01-19 LAB — URINALYSIS, ROUTINE W REFLEX MICROSCOPIC
Bilirubin Urine: NEGATIVE
Glucose, UA: NEGATIVE mg/dL
Ketones, ur: NEGATIVE mg/dL
Leukocytes,Ua: NEGATIVE
Nitrite: NEGATIVE
Protein, ur: NEGATIVE mg/dL
Specific Gravity, Urine: 1.008 (ref 1.005–1.030)
pH: 8 (ref 5.0–8.0)

## 2022-01-19 LAB — CBC
HCT: 39.1 % (ref 36.0–46.0)
Hemoglobin: 11.8 g/dL — ABNORMAL LOW (ref 12.0–15.0)
MCH: 24.3 pg — ABNORMAL LOW (ref 26.0–34.0)
MCHC: 30.2 g/dL (ref 30.0–36.0)
MCV: 80.6 fL (ref 80.0–100.0)
Platelets: 320 10*3/uL (ref 150–400)
RBC: 4.85 MIL/uL (ref 3.87–5.11)
RDW: 14.2 % (ref 11.5–15.5)
WBC: 9.8 10*3/uL (ref 4.0–10.5)
nRBC: 0 % (ref 0.0–0.2)

## 2022-01-19 LAB — WET PREP, GENITAL
Clue Cells Wet Prep HPF POC: NONE SEEN
Sperm: NONE SEEN
Trich, Wet Prep: NONE SEEN
WBC, Wet Prep HPF POC: >=10 — AB (ref <10)
Yeast Wet Prep HPF POC: NONE SEEN

## 2022-01-19 LAB — HIV ANTIBODY (ROUTINE TESTING W REFLEX): HIV Screen 4th Generation wRfx: NONREACTIVE

## 2022-01-19 LAB — HCG, QUANTITATIVE, PREGNANCY: hCG, Beta Chain, Quant, S: 2376 m[IU]/mL — ABNORMAL HIGH (ref <5)

## 2022-01-19 LAB — POCT PREGNANCY, URINE: Preg Test, Ur: POSITIVE — AB

## 2022-01-19 NOTE — MAU Note (Signed)
Gabriela Morgan is a 41 y.o. at here in MAU reporting: had a + UPT on Monday. They past 2 days has been bleeding. States it is just spotting. Having some cramping. ? ?LMP: 12/04/2021 ? ?Onset of complaint: ongoing ? ?Pain score: 2/10 ? ?Vitals:  ? 01/19/22 1024  ?BP: 115/61  ?Pulse: 86  ?Resp: 16  ?Temp: 98.4 ?F (36.9 ?C)  ?SpO2: 100%  ?   ?Lab orders placed from triage: upt, ua ? ?

## 2022-01-19 NOTE — MAU Provider Note (Addendum)
?History  ?  ? ?CSN: [id number] ? ?Arrival date and time: 01/19/22 1008 ? ? None  ?  ? ?Chief Complaint  ?Patient presents with  ? Abdominal Pain  ? Vaginal Bleeding  ? ?HPI ? ?Gabriela Morgan is a 41 y.o. [id number] w/ pmh anemia, preterm labor, uterine fibroid presenting with vaginal bleeding for the past 2 days. She describes the bleeding as very minimal, not requiring a liner. The blood is dark red and less than the day before. She is also experiencing cramping on her LLQ, which is improving. She has a h/o miscarriage last year. Her last menstrual period was 3/16. ? ?OB History   ? ? Gravida  ?6  ? Para  ?4  ? Term  ?2  ? Preterm  ?2  ? AB  ?1  ? Living  ?4  ?  ? ? SAB  ?1  ? IAB  ?0  ? Ectopic  ?0  ? Multiple  ?0  ? Live Births  ?4  ?   ?  ? Obstetric Comments  ?2014 approx 28 weeks ?2010 approx 30 weeks  ?  ? ?  ? ? ?Past Medical History:  ?Diagnosis Date  ? Anemia   ? Hyperemesis   ? Preterm labor   ? Uterine fibroid   ? ? ?Past Surgical History:  ?Procedure Laterality Date  ? APPENDECTOMY  2015  ? in [place]  ? ? ?Family History  ?Problem Relation Age of Onset  ? Diabetes Mother   ? Diabetes Brother   ? Healthy Father   ? ? ?Social History  ? ?Tobacco Use  ? Smoking status: Never  ? Smokeless tobacco: Never  ?Vaping Use  ? Vaping Use: Never used  ?Substance Use Topics  ? Alcohol use: Not Currently  ? Drug use: Not Currently  ? ? ?Allergies: No Known Allergies ? ?Medications Prior to Admission  ?Medication Sig Dispense Refill Last Dose  ? acetaminophen (TYLENOL) 500 MG tablet Take 2 tablets (1,000 mg total) by mouth every 6 (six) hours as needed (for pain scale < 4). 60 tablet 0   ? Prenatal Vit-Fe Fumarate-FA (PRENATAL MULTIVITAMIN) TABS tablet Take 1 tablet by mouth daily at 12 noon. (Patient not taking: Reported on 06/07/2020)     ? ? ?Review of Systems  ?Constitutional: Negative.   ?HENT: Negative.    ?Eyes: Negative.   ?Respiratory: Negative.    ?Cardiovascular: Negative.   ?Gastrointestinal: Negative.    ?Genitourinary:  Positive for vaginal bleeding.  ?Neurological: Negative.   ?Physical Exam  ? ?Blood pressure 115/61, pulse 86, temperature 98.4 ?F (36.9 ?C), temperature source Oral, resp. rate 16, height '5\' 3"'$  (1.6 m), weight 66 kg, last menstrual period 12/04/2021, SpO2 100 %, unknown if currently breastfeeding. ? ?Physical Exam ?Constitutional:   ?   Appearance: She is well-developed.  ?HENT:  ?   Head: Normocephalic and atraumatic.  ?Cardiovascular:  ?   Rate and Rhythm: Normal rate and regular rhythm.  ?Pulmonary:  ?   Effort: Pulmonary effort is normal.  ?   Breath sounds: Normal breath sounds.  ?Abdominal:  ?   General: Abdomen is flat. There is no distension.  ?   Tenderness: There is no abdominal tenderness.  ?Neurological:  ?   Mental Status: She is alert.  ? ? ?MAU Course  ? ? ?MDM ?1st trimester vaginal bleeding workup ?- [place] ?- CBC ?- bHCG  ?- HIV, gonorrhea/chlamydia  ? ?Assessment and Plan  ?[redacted] week gestation  ?First trimester  vaginal bleeding ? - CBC normal ? - bHCG 2376 ? - US shows small subchorionic hemorrhage ? - repeat bHCG in 48 hrs and f/u US in 10 days ?Discharge  ? -  return precautions ? ?Chengyu Weng ?01/19/2022, 10:56 AM  ? ? ?Attestation of Supervision of Student:  I confirm that I have verified the information documented in the medical student?s note and that I have also personally reperformed the history, physical exam and all medical decision making activities.  I have verified that all services and findings are accurately documented in this student's note; and I agree with management and plan as outlined in the documentation. I have also made any necessary editorial changes. ? ?Patient seen for bleeding that started two days ago. She went to the office and had a UPT that was positive and came here. Having light bleeding and spotting. No pain. ? ?BP 109/70 (BP Location: Right Arm)   Pulse 81   Temp 98.4 ?F (36.9 ?C) (Oral)   Resp 14   Ht '5\' 3"'$  (1.6 m)   Wt 66 kg   LMP 12/04/2021    SpO2 99%   BMI 25.79 kg/m?  ?A&Ox3, NAD ?CTA ?Soft, NT. ?No edema. ? ?Results for orders placed or performed during the hospital encounter of 01/19/22 (from the past 24 hour(s))  ?Urinalysis, Routine w reflex microscopic Urine, Clean Catch     Status: Abnormal  ? Collection Time: 01/19/22 10:33 AM  ?Result Value Ref Range  ? Color, Urine STRAW (A) YELLOW  ? APPearance CLEAR CLEAR  ? Specific Gravity, Urine 1.008 1.005 - 1.030  ? pH 8.0 5.0 - 8.0  ? Glucose, UA NEGATIVE NEGATIVE mg/dL  ? Hgb urine dipstick MODERATE (A) NEGATIVE  ? Bilirubin Urine NEGATIVE NEGATIVE  ? Ketones, ur NEGATIVE NEGATIVE mg/dL  ? Protein, ur NEGATIVE NEGATIVE mg/dL  ? Nitrite NEGATIVE NEGATIVE  ? Leukocytes,Ua NEGATIVE NEGATIVE  ? RBC / HPF 21-50 0 - 5 RBC/hpf  ? WBC, UA 0-5 0 - 5 WBC/hpf  ? Bacteria, UA RARE (A) NONE SEEN  ? Squamous Epithelial / LPF 0-5 0 - 5  ?hCG, quantitative, pregnancy     Status: Abnormal  ? Collection Time: 01/19/22 11:15 AM  ?Result Value Ref Range  ? hCG, Beta Chain, Quant, S 2,376 (H) <5 mIU/mL  ?CBC     Status: Abnormal  ? Collection Time: 01/19/22 11:15 AM  ?Result Value Ref Range  ? WBC 9.8 4.0 - 10.5 K/uL  ? RBC 4.85 3.87 - 5.11 MIL/uL  ? Hemoglobin 11.8 (L) 12.0 - 15.0 g/dL  ? HCT 39.1 36.0 - 46.0 %  ? MCV 80.6 80.0 - 100.0 fL  ? MCH 24.3 (L) 26.0 - 34.0 pg  ? MCHC 30.2 30.0 - 36.0 g/dL  ? RDW 14.2 11.5 - 15.5 %  ? Platelets 320 150 - 400 K/uL  ? nRBC 0.0 0.0 - 0.2 %  ?HIV Antibody (routine testing w rflx)     Status: None  ? Collection Time: 01/19/22 11:15 AM  ?Result Value Ref Range  ? HIV Screen 4th Generation wRfx Non Reactive Non Reactive  ?Wet prep, genital     Status: Abnormal  ? Collection Time: 01/19/22 11:37 AM  ? Specimen: PATH Cytology Cervicovaginal Ancillary Only  ?Result Value Ref Range  ? Yeast Wet Prep HPF POC NONE SEEN NONE SEEN  ? Trich, Wet Prep NONE SEEN NONE SEEN  ? Clue Cells Wet Prep HPF POC NONE SEEN NONE SEEN  ? WBC, Wet  Prep HPF POC >=10 (A) <10  ? Sperm NONE SEEN   ? ?US OB LESS  THAN 14 WEEKS WITH OB TRANSVAGINAL ? ?Result Date: 01/19/2022 ?CLINICAL DATA:  Bleeding/spotting for 2 days. First trimester pregnancy EXAM: OBSTETRIC <14 WK [place] AND TRANSVAGINAL OB US TECHNIQUE: Both transabdominal and transvaginal ultrasound examinations were performed for complete evaluation of the gestation as well as the maternal uterus, adnexal regions, and pelvic cul-de-sac. Transvaginal technique was performed to assess early pregnancy. COMPARISON:  06/05/2020 FINDINGS: Intrauterine gestational sac: Single Yolk sac:  Visualized and measuring 4 mm. Embryo:  Possibly present Cardiac Activity: Not confidently seen CRL:  2.6 mm   5 w   5 d Subchorionic hemorrhage: Possibly present and measuring up to 4 mm along the lower sac. Maternal uterus/adnexae: Normal appearance of the ovaries. IMPRESSION: 1. Single intrauterine pregnancy with yolk sac and probable embryo measuring 2.6 mm. Cardiac activity is not yet detected. 2. Suspect small subchorionic hemorrhage. Electronically Signed   By: Jorje Guild M.D.   On: 01/19/2022 12:12   ? ?1. [redacted] weeks gestation of pregnancy   ?2. First trimester bleeding   ? ?I personally reviewed [place] - CRL measuring [redacted]w[redacted]d No cardiac activity seen. Small subchorionic hematoma.  ? ?F/u hcg in 2 days. ?Return precautions given. ? ?JTruett Mainland DO ?[facility]?01/19/2022 1:00 PM ? ?

## 2022-01-20 ENCOUNTER — Inpatient Hospital Stay (HOSPITAL_COMMUNITY): Payer: Self-pay

## 2022-01-20 ENCOUNTER — Ambulatory Visit: Payer: Self-pay

## 2022-01-20 ENCOUNTER — Inpatient Hospital Stay (HOSPITAL_COMMUNITY)
Admission: AD | Admit: 2022-01-20 | Discharge: 2022-01-20 | Disposition: A | Discharge status: Home / Self Care | Payer: Self-pay | Attending: Obstetrics and Gynecology | Admitting: Obstetrics and Gynecology

## 2022-01-20 ENCOUNTER — Encounter (HOSPITAL_COMMUNITY): Payer: Self-pay | Admitting: Obstetrics and Gynecology

## 2022-01-20 ENCOUNTER — Telehealth: Payer: Self-pay

## 2022-01-20 DIAGNOSIS — Z3491 Encounter for supervision of normal pregnancy, unspecified, first trimester: Secondary | ICD-10-CM

## 2022-01-20 DIAGNOSIS — O09521 Supervision of elderly multigravida, first trimester: Secondary | ICD-10-CM | POA: Insufficient documentation

## 2022-01-20 DIAGNOSIS — Z3A01 Less than 8 weeks gestation of pregnancy: Secondary | ICD-10-CM | POA: Insufficient documentation

## 2022-01-20 DIAGNOSIS — O209 Hemorrhage in early pregnancy, unspecified: Secondary | ICD-10-CM | POA: Insufficient documentation

## 2022-01-20 LAB — GC/CHLAMYDIA PROBE AMP (~~LOC~~) NOT AT ARMC
Chlamydia: NEGATIVE
Comment: NEGATIVE
Comment: NORMAL
Neisseria Gonorrhea: NEGATIVE

## 2022-01-20 LAB — CBC
HCT: 37.1 % (ref 36.0–46.0)
Hemoglobin: 11.7 g/dL — ABNORMAL LOW (ref 12.0–15.0)
MCH: 25.2 pg — ABNORMAL LOW (ref 26.0–34.0)
MCHC: 31.5 g/dL (ref 30.0–36.0)
MCV: 80 fL (ref 80.0–100.0)
Platelets: 336 10*3/uL (ref 150–400)
RBC: 4.64 MIL/uL (ref 3.87–5.11)
RDW: 14.1 % (ref 11.5–15.5)
WBC: 10.7 10*3/uL — ABNORMAL HIGH (ref 4.0–10.5)
nRBC: 0 % (ref 0.0–0.2)

## 2022-01-20 LAB — HCG, QUANTITATIVE, PREGNANCY: hCG, Beta Chain, Quant, S: 2698 m[IU]/mL — ABNORMAL HIGH (ref <5)

## 2022-01-20 NOTE — Discharge Instructions (Signed)

## 2022-01-20 NOTE — Telephone Encounter (Signed)
Patient scheduled for MAU follow up stat beta HCG this AM. Not needed until tomorrow. Called patient with interpreter [place], appt changed to 01/21/22 at 1040. ?

## 2022-01-20 NOTE — MAU Note (Signed)
..  Gabriela Morgan is a 41 y.o. at 23w5dhere in MAU reporting: vaginal bleeding, reports that she was seen in MAU for her bleeding yesterday but it has gotten worse. She has to wear a pad but does not saturate it. Has small clots and it is dark red. Has abdominal cramping that she took tylenol for yesterday but none today. The tylenol yesterday helped only a little.  ? ?Onset of complaint: 2 days ago ?Pain score: 5/10 ?Vitals:  ? 01/20/22 2119 01/20/22 2120  ?BP:  104/70  ?Pulse:  85  ?Resp:  15  ?Temp:  98.3 ?F (36.8 ?C)  ?SpO2: 99%   ?   ? ?Lab orders placed from triage: UA ? ?

## 2022-01-20 NOTE — MAU Provider Note (Signed)
?History  ?  ? ?CSN: [id number] ? ?Arrival date and time: 01/20/22 2032 ? ? Event Date/Time  ? First Provider Initiated Contact with Patient 01/20/22 2248   ?  ? ?Chief Complaint  ?Patient presents with  ? Vaginal Bleeding  ? ?HPI ?Gabriela Morgan is a 41 y.o. [phone] at 18w5dwho presents with bleeding and cramping. She reports it is the same as yesterday and not heavier. She reports she is very worried because she had a miscarriage last year. She denies any pain at this time but when it comes, it is a 6/10. She has not taken anything for the pain.  ? ?OB History   ? ? Gravida  ?6  ? Para  ?4  ? Term  ?2  ? Preterm  ?2  ? AB  ?1  ? Living  ?4  ?  ? ? SAB  ?1  ? IAB  ?0  ? Ectopic  ?0  ? Multiple  ?0  ? Live Births  ?4  ?   ?  ? Obstetric Comments  ?2014 approx 28 weeks ?2010 approx 30 weeks  ?  ? ?  ? ? ?Past Medical History:  ?Diagnosis Date  ? Anemia   ? Hyperemesis   ? Preterm labor   ? Uterine fibroid   ? ? ?Past Surgical History:  ?Procedure Laterality Date  ? [place] ? ? ?Family History  ?Problem Relation Age of Onset  ? Diabetes Mother   ? Diabetes Brother   ? Healthy Father   ? ? ?Social History  ? ?Tobacco Use  ? Smoking status: Never  ? Smokeless tobacco: Never  ?Vaping Use  ? Vaping Use: Never used  ?Substance Use Topics  ? Alcohol use: Not Currently  ? Drug use: Not Currently  ? ? ?Allergies: No Known Allergies ? ?Medications Prior to Admission  ?Medication Sig Dispense Refill Last Dose  ? acetaminophen (TYLENOL) 500 MG tablet Take 2 tablets (1,000 mg total) by mouth every 6 (six) hours as needed (for pain scale < 4). 60 tablet 0 01/19/2022  ? Prenatal Vit-Fe Fumarate-FA (PRENATAL MULTIVITAMIN) TABS tablet Take 1 tablet by mouth daily at 12 noon. (Patient not taking: Reported on 06/07/2020)     ? ? ?Review of Systems  ?Constitutional: Negative.  Negative for fatigue and fever.  ?HENT: Negative.    ?Respiratory: Negative.  Negative for shortness of breath.    ?Cardiovascular: Negative.  Negative for chest pain.  ?Gastrointestinal:  Positive for abdominal pain. Negative for constipation, diarrhea, nausea and vomiting.  ?Genitourinary:  Positive for vaginal bleeding. Negative for dysuria and vaginal discharge.  ?Neurological: Negative.  Negative for dizziness and headaches.  ?Physical Exam  ? ?Blood pressure 104/70, pulse 85, temperature 98.3 ?F (36.8 ?C), temperature source Oral, resp. rate 15, height 5' 2.99" (1.6 m), weight 66.4 kg, last menstrual period 12/04/2021, SpO2 99 %, unknown if currently breastfeeding. ? ?Physical Exam ?Vitals and nursing note reviewed.  ?Constitutional:   ?   General: She is not in acute distress. ?   Appearance: She is well-developed.  ?HENT:  ?   Head: Normocephalic.  ?Eyes:  ?   Pupils: Pupils are equal, round, and reactive to light.  ?Cardiovascular:  ?   Rate and Rhythm: Normal rate and regular rhythm.  ?   Heart sounds: Normal heart sounds.  ?Pulmonary:  ?   Effort: Pulmonary effort is normal. No respiratory distress.  ?   Breath sounds:  Normal breath sounds.  ?Abdominal:  ?   General: Bowel sounds are normal. There is no distension.  ?   Palpations: Abdomen is soft.  ?   Tenderness: There is no abdominal tenderness.  ?Skin: ?   General: Skin is warm and dry.  ?Neurological:  ?   Mental Status: She is alert and oriented to person, place, and time.  ?Psychiatric:     ?   Mood and Affect: Mood normal.     ?   Behavior: Behavior normal.     ?   Thought Content: Thought content normal.     ?   Judgment: Judgment normal.  ? ? ?MAU Course  ?Procedures ?Results for orders placed or performed during the hospital encounter of 01/20/22 (from the past 24 hour(s))  ?CBC     Status: Abnormal  ? Collection Time: 01/20/22  9:39 PM  ?Result Value Ref Range  ? WBC 10.7 (H) 4.0 - 10.5 K/uL  ? RBC 4.64 3.87 - 5.11 MIL/uL  ? Hemoglobin 11.7 (L) 12.0 - 15.0 g/dL  ? HCT 37.1 36.0 - 46.0 %  ? MCV 80.0 80.0 - 100.0 fL  ? MCH 25.2 (L) 26.0 - 34.0 pg  ? MCHC  31.5 30.0 - 36.0 g/dL  ? RDW 14.1 11.5 - 15.5 %  ? Platelets 336 150 - 400 K/uL  ? nRBC 0.0 0.0 - 0.2 %  ?hCG, quantitative, pregnancy     Status: Abnormal  ? Collection Time: 01/20/22  9:39 PM  ?Result Value Ref Range  ? hCG, Beta Chain, Quant, S 2,698 (H) <5 mIU/mL  ?  ?MDM ?CBC, HCG ?US OB Transvaginal ? ?Ultrasound stable. Slight rise in HCG since yesterday. Reassurance provided of findings of exam and labs ? ?Reviewed results of subchorionic hemorrhage with patient and partner. Discussed that this is a common finding in the first trimester and does not usually cause problems in the pregnancy like loss or difficulty with development. Reviewed expectations for vaginal bleeding including a small amount possibly for several weeks. Reviewed warning signs of heavy bleeding, saturating a pad in less than an hour, and severe pain as reasons to come back to MAU. Encouraged patient to exercise pelvic rest until 7 days after bleeding stops. Patient and support person verbalized understanding.  ? ?Cancelled repeat HCG tomorrow and order placed for repeat ultrasound in 10-14 days to confirm viability. ? ?Assessment and Plan  ? ?1. Normal intrauterine pregnancy on prenatal ultrasound in first trimester   ?2. [redacted] weeks gestation of pregnancy   ? ?-Discharge home in stable condition ?-First trimester precautions discussed ?-Patient advised to follow-up with [facility] in 10-14 days for repeat ultrasound, order placed ?-Patient may return to MAU as needed or if her condition were to change or worsen ? ? ?Wende Mott CNM ?01/20/2022, 10:48 PM  ?

## 2022-01-20 NOTE — Progress Notes (Signed)
Pt here today for UPT resulting positive.  Pt checked in and was called from the waiting room x 2 with no response.  Per chart review, pt went to MAU for evaluation of vaginal bleeding.   ? ?Jonnette Nuon,RN  ?01/19/22 ?

## 2022-01-20 NOTE — Progress Notes (Signed)
Len Blalock CNM in [place] [facility] to discuss test results and d/c plan with pt. In house [place] interpreter, Arbie Cookey, used for d/c. Written and verbal d/c instructions given and  understanding voiced.  ?

## 2022-01-21 ENCOUNTER — Ambulatory Visit: Payer: Self-pay

## 2022-01-24 ENCOUNTER — Inpatient Hospital Stay (HOSPITAL_COMMUNITY): Payer: Self-pay

## 2022-01-24 ENCOUNTER — Inpatient Hospital Stay (HOSPITAL_COMMUNITY)
Admission: AD | Admit: 2022-01-24 | Discharge: 2022-01-24 | Disposition: A | Discharge status: Home / Self Care | Payer: Self-pay | Attending: Obstetrics & Gynecology | Admitting: Obstetrics & Gynecology

## 2022-01-24 ENCOUNTER — Encounter (HOSPITAL_COMMUNITY): Payer: Self-pay | Admitting: Obstetrics & Gynecology

## 2022-01-24 DIAGNOSIS — Z6790 Unspecified blood type, Rh positive: Secondary | ICD-10-CM

## 2022-01-24 DIAGNOSIS — O039 Complete or unspecified spontaneous abortion without complication: Secondary | ICD-10-CM

## 2022-01-24 LAB — CBC
HCT: 36.4 % (ref 36.0–46.0)
Hemoglobin: 11.2 g/dL — ABNORMAL LOW (ref 12.0–15.0)
MCH: 24.7 pg — ABNORMAL LOW (ref 26.0–34.0)
MCHC: 30.8 g/dL (ref 30.0–36.0)
MCV: 80.2 fL (ref 80.0–100.0)
Platelets: 296 10*3/uL (ref 150–400)
RBC: 4.54 MIL/uL (ref 3.87–5.11)
RDW: 14.1 % (ref 11.5–15.5)
WBC: 8.2 10*3/uL (ref 4.0–10.5)
nRBC: 0 % (ref 0.0–0.2)

## 2022-01-24 LAB — HCG, QUANTITATIVE, PREGNANCY: hCG, Beta Chain, Quant, S: 416 m[IU]/mL — ABNORMAL HIGH (ref <5)

## 2022-01-24 MED ORDER — OXYCODONE-ACETAMINOPHEN 5-325 MG PO TABS
1.0000 | ORAL_TABLET | Freq: Once | ORAL | Status: AC
  Administered 2022-01-24: 1 via ORAL
  Filled 2022-01-24: qty 1

## 2022-01-24 MED ORDER — OXYCODONE-ACETAMINOPHEN 5-325 MG PO TABS: 1.0000 | ORAL_TABLET | ORAL | 0 refills | Status: AC | PRN

## 2022-01-24 MED ORDER — IBUPROFEN 600 MG PO TABS: 600.0000 mg | ORAL_TABLET | Freq: Four times a day (QID) | ORAL | 0 refills | Status: DC | PRN

## 2022-01-24 NOTE — MAU Provider Note (Signed)
?History  ?  ? ?CSN: [id number] ? ?Arrival date and time: 01/24/22 1029 ? ? Event Date/Time  ? First Provider Initiated Contact with Patient 01/24/22 1331   ?  ? ?Chief Complaint  ?Patient presents with  ? Vaginal Bleeding  ? Abdominal Pain  ? ?HPI ?Gabriela Morgan is a 41 y.o. [phone] at 51w2dwith chief complaint of abdominal pain and vaginal bleeding. These are recurrent problems for which patient has previously been seen in MAU. She endorses sharp generalized pain across her mid-abdomen beginning around 0400 today. On arrival to MAU her abdominal pain is rated 9/10. She requests pain medication and requests oral route. Today's pain was accompanied by an increase in bleeding. She reports saturating two pads in the hour before her arrival to MAU. She denies abdominal tenderness, flank pain, fever or recent illness. She is not experiencing weakness, dizziness or syncope. ? ?OB History   ? ? Gravida  ?6  ? Para  ?4  ? Term  ?2  ? Preterm  ?2  ? AB  ?1  ? Living  ?4  ?  ? ? SAB  ?1  ? IAB  ?0  ? Ectopic  ?0  ? Multiple  ?0  ? Live Births  ?4  ?   ?  ? Obstetric Comments  ?2014 approx 28 weeks ?2010 approx 30 weeks  ?  ? ?  ? ? ?Past Medical History:  ?Diagnosis Date  ? Anemia   ? Hyperemesis   ? Preterm labor   ? Uterine fibroid   ? ? ?Past Surgical History:  ?Procedure Laterality Date  ? [place]  [place] ? ? ?Family History  ?Problem Relation Age of Onset  ? Diabetes Mother   ? Diabetes Brother   ? Healthy Father   ? ? ?Social History  ? ?Tobacco Use  ? Smoking status: Never  ? Smokeless tobacco: Never  ?Vaping Use  ? Vaping Use: Never used  ?Substance Use Topics  ? Alcohol use: Not Currently  ? Drug use: Not Currently  ? ? ?Allergies: No Known Allergies ? ?Medications Prior to Admission  ?Medication Sig Dispense Refill Last Dose  ? acetaminophen (TYLENOL) 500 MG tablet Take 2 tablets (1,000 mg total) by mouth every 6 (six) hours as needed (for pain scale < 4). 60 tablet 0 01/24/2022 at  0600  ? Prenatal Vit-Fe Fumarate-FA (PRENATAL MULTIVITAMIN) TABS tablet Take 1 tablet by mouth daily at 12 noon. (Patient not taking: Reported on 06/07/2020)     ? ? ?Review of Systems  ?Gastrointestinal:  Positive for abdominal pain.  ?Genitourinary:  Positive for vaginal bleeding.  ?All other systems reviewed and are negative. ?Physical Exam  ? ?Blood pressure 98/64, pulse 81, temperature 98 ?F (36.7 ?C), temperature source Oral, resp. rate 16, last menstrual period 12/04/2021, SpO2 100 %, unknown if currently breastfeeding. ? ?Physical Exam ?Vitals and nursing note reviewed. Exam conducted with a chaperone present.  ?Constitutional:   ?   General: She is in acute distress.  ?   Appearance: She is well-developed. She is not ill-appearing.  ?Cardiovascular:  ?   Rate and Rhythm: Normal rate and regular rhythm.  ?   Heart sounds: Normal heart sounds.  ?Pulmonary:  ?   Effort: Pulmonary effort is normal.  ?   Breath sounds: Normal breath sounds.  ?Abdominal:  ?   Palpations: Abdomen is soft.  ?   Tenderness: There is no abdominal tenderness.  ?Genitourinary: ?  Comments: Pelvic exam: External genitalia normal, vaginal walls pink and well rugated, cervix visually closed, no lesions noted. Moderate volume dark red blood in vault. Removed with fox swab x 2. Scant trickle of dark red blood oozing from cervical os.  ? ? ?Skin: ?   General: Skin is warm.  ?   Capillary Refill: Capillary refill takes less than 2 seconds.  ?Neurological:  ?   Mental Status: She is alert and oriented to person, place, and time.  ?Psychiatric:     ?   Mood and Affect: Mood normal.     ?   Behavior: Behavior normal.  ? ? ?MAU Course  ?Procedures ? ?MDM ?Orders Placed This Encounter  ?Procedures  ? US OB LESS THAN 14 WEEKS WITH OB TRANSVAGINAL  ? CBC  ? hCG, quantitative, pregnancy  ? Discharge patient  ? ?Patient Vitals for the past 24 hrs: ? BP Temp Temp src Pulse Resp SpO2  ?01/24/22 1352 121/72 -- -- 70 16 --  ?01/24/22 1050 98/64 -- -- 81  -- --  ?01/24/22 1044 116/62 98 ?F (36.7 ?C) Oral 80 16 100 %  ? ?Results for orders placed or performed during the hospital encounter of 01/24/22 (from the past 24 hour(s))  ?CBC     Status: Abnormal  ? Collection Time: 01/24/22 11:20 AM  ?Result Value Ref Range  ? WBC 8.2 4.0 - 10.5 K/uL  ? RBC 4.54 3.87 - 5.11 MIL/uL  ? Hemoglobin 11.2 (L) 12.0 - 15.0 g/dL  ? HCT 36.4 36.0 - 46.0 %  ? MCV 80.2 80.0 - 100.0 fL  ? MCH 24.7 (L) 26.0 - 34.0 pg  ? MCHC 30.8 30.0 - 36.0 g/dL  ? RDW 14.1 11.5 - 15.5 %  ? Platelets 296 150 - 400 K/uL  ? nRBC 0.0 0.0 - 0.2 %  ? ?Component ?    Latest Ref Rng 01/20/2022 01/24/2022  ?HCG, Beta Chain, Quant, S ?    <5 mIU/mL 2,698 (H)  416 (H)   ?  ?US OB LESS THAN 14 WEEKS WITH OB TRANSVAGINAL ? ?Result Date: 01/24/2022 ?CLINICAL DATA:  Vaginal bleeding in 1st trimester pregnancy. EXAM: OBSTETRIC <14 WK [place] AND TRANSVAGINAL OB US TECHNIQUE: Both transabdominal and transvaginal ultrasound examinations were performed for complete evaluation of the gestation as well as the maternal uterus, adnexal regions, and pelvic cul-de-sac. Transvaginal technique was performed to assess early pregnancy. COMPARISON:  01/20/2022 FINDINGS: Intrauterine gestational sac: None, previously seen intrauterine gestational sac is no longer visualized Maternal uterus/adnexae: Endometrial thickness measures 5 mm. No fibroids identified. Both ovaries are normal appearance. No adnexal mass or abnormal free fluid identified. IMPRESSION: Previously seen intrauterine gestational sac is no longer visualized, consistent with completed spontaneous abortion. Electronically Signed   By: Marlaine Hind M.D.   On: 01/24/2022 13:16   ? ?Meds ordered this encounter  ?Medications  ? oxyCODONE-acetaminophen (PERCOCET/ROXICET) 5-325 MG per tablet 1 tablet  ? ibuprofen (ADVIL) 600 MG tablet  ?  Sig: Take 1 tablet (600 mg total) by mouth every 6 (six) hours as needed for up to 30 doses.  ?  Dispense:  30 tablet  ?  Refill:  0  ?  Order Specific  Question:   Supervising Provider  ?  Answer:   [place] Mode [[id number]]  ? oxyCODONE-acetaminophen (PERCOCET/ROXICET) 5-325 MG tablet  ?  Sig: Take 1 tablet by mouth every 4 (four) hours as needed for up to 2 days for severe pain.  ?  Dispense:  12 tablet  ?  Refill:  0  ?  Order Specific Question:   Supervising Provider  ?  Answer:   [place] Mode [[id number]]  ? ?Assessment and Plan  ?--41 y.o. [id number] with miscarriage in progress ?--Previously visualized GS and YS from 01/20/22 no longer visible on [place] ?--Hgb 11.2 ?--Quant hCG 416 ?--Blood type B POS ?--Language barrier: Spanish-language interpreter utilized for all patient interaction ?--Discharge home in stable condition ? ?F/U: ?Message sent to [facility] to coordinate non-stat Quant hCG and Provider visit in one week ? ?Darlina Rumpf, MSA, MSN, CNM ?01/24/2022, 7:19 PM  ?

## 2022-01-24 NOTE — MAU Note (Signed)
.  Gabriela Morgan is a 41 y.o. at 43w2dhere in MAU reporting: increased vaginal bleeding and pain this morning at 0400. She states she has saturated 2 pads in the last hour. She took 1,'000mg'$  of tylenol at 0600.  ? ?Onset of complaint: 0400 ?Pain score: 9 ?Vitals:  ? 01/24/22 1044  ?BP: 116/62  ?Pulse: 80  ?Resp: 16  ?Temp: 98 ?F (36.7 ?C)  ?SpO2: 100%  ?   ? ?

## 2022-02-12 ENCOUNTER — Ambulatory Visit: Payer: Self-pay | Admitting: Family Medicine

## 2022-11-06 ENCOUNTER — Emergency Department (HOSPITAL_COMMUNITY): Payer: Self-pay

## 2022-11-06 ENCOUNTER — Emergency Department (HOSPITAL_COMMUNITY)
Admission: EM | Admit: 2022-11-06 | Discharge: 2022-11-07 | Disposition: A | Discharge status: Home / Self Care | Payer: Self-pay | Attending: Emergency Medicine | Admitting: Emergency Medicine

## 2022-11-06 DIAGNOSIS — R1032 Left lower quadrant pain: Secondary | ICD-10-CM

## 2022-11-06 DIAGNOSIS — D259 Leiomyoma of uterus, unspecified: Secondary | ICD-10-CM | POA: Insufficient documentation

## 2022-11-06 LAB — CBC
HCT: 39.1 % (ref 36.0–46.0)
Hemoglobin: 12.3 g/dL (ref 12.0–15.0)
MCH: 25.8 pg — ABNORMAL LOW (ref 26.0–34.0)
MCHC: 31.5 g/dL (ref 30.0–36.0)
MCV: 82.1 fL (ref 80.0–100.0)
Platelets: 257 10*3/uL (ref 150–400)
RBC: 4.76 MIL/uL (ref 3.87–5.11)
RDW: 14.5 % (ref 11.5–15.5)
WBC: 8.3 10*3/uL (ref 4.0–10.5)
nRBC: 0 % (ref 0.0–0.2)

## 2022-11-06 LAB — I-STAT BETA HCG BLOOD, ED (MC, WL, AP ONLY): I-stat hCG, quantitative: <5 m[IU]/mL (ref <5)

## 2022-11-06 LAB — COMPREHENSIVE METABOLIC PANEL
ALT: 21 U/L (ref 0–44)
AST: 21 U/L (ref 15–41)
Albumin: 3.9 g/dL (ref 3.5–5.0)
Alkaline Phosphatase: 64 U/L (ref 38–126)
Anion gap: 10 (ref 5–15)
BUN: 11 mg/dL (ref 6–20)
CO2: 22 mmol/L (ref 22–32)
Calcium: 8.6 mg/dL — ABNORMAL LOW (ref 8.9–10.3)
Chloride: 107 mmol/L (ref 98–111)
Creatinine, Ser: 0.65 mg/dL (ref 0.44–1.00)
GFR, Estimated: >60 mL/min (ref >60)
Glucose, Bld: 113 mg/dL — ABNORMAL HIGH (ref 70–99)
Potassium: 3.6 mmol/L (ref 3.5–5.1)
Sodium: 139 mmol/L (ref 135–145)
Total Bilirubin: <0.1 mg/dL — ABNORMAL LOW (ref 0.3–1.2)
Total Protein: 7.6 g/dL (ref 6.5–8.1)

## 2022-11-06 LAB — URINALYSIS, ROUTINE W REFLEX MICROSCOPIC
Bacteria, UA: NONE SEEN
Bilirubin Urine: NEGATIVE
Glucose, UA: NEGATIVE mg/dL
Ketones, ur: 5 mg/dL — AB
Leukocytes,Ua: NEGATIVE
Nitrite: NEGATIVE
Protein, ur: 30 mg/dL — AB
RBC / HPF: >50 RBC/hpf (ref 0–5)
Specific Gravity, Urine: 1.029 (ref 1.005–1.030)
pH: 5 (ref 5.0–8.0)

## 2022-11-06 LAB — LIPASE, BLOOD: Lipase: 32 U/L (ref 11–51)

## 2022-11-06 NOTE — ED Provider Triage Note (Addendum)
Emergency Medicine Provider Triage Evaluation Note  Gabriela Morgan , a 42 y.o. female  was evaluated in triage.  Pt complains of LLQ abdominal pain starting yesterday. Associated nausea and heavy vaginal bleeding with clots. Describes the pain as "in her ovaries". Felt similar symptoms with miscarriage last year. Has not felt symptoms to make her think she is pregnant. LMP 1/24  Review of Systems  Positive: Abd pain, vaginal bleeding, nausea Negative: Vomiting, constipation, diarrhea, urinary sx  Physical Exam  BP 117/88   Pulse 88   Temp 99.3 F (37.4 C) (Oral)   Resp 18   LMP 12/04/2021   SpO2 100%  Gen:   Awake, no distress   Resp:  Normal effort  MSK:   Moves extremities without difficulty  Other:  LLQ tenderness without guarding  Medical Decision Making  Medically screening exam initiated at 8:12 PM.  Appropriate orders placed.  Lafonda Mosses was informed that the remainder of the evaluation will be completed by another provider, this initial triage assessment does not replace that evaluation, and the importance of remaining in the ED until their evaluation is complete.  Workup initiated   Dennisha Mouser T, PA-C 11/06/22 2012    Mayerli Kirst T, PA-C 11/06/22 2012

## 2022-11-06 NOTE — ED Triage Notes (Signed)
Patient reports LLQ pain with nausea and heavy vaginal bleeding onset yesterday .

## 2022-11-07 MED ORDER — IBUPROFEN 400 MG PO TABS
400.0000 mg | ORAL_TABLET | Freq: Once | ORAL | Status: AC
  Administered 2022-11-07: 400 mg via ORAL
  Filled 2022-11-07: qty 1

## 2022-11-07 NOTE — ED Provider Notes (Signed)
[facility] Provider Note   CSN: [id number] Arrival date & time: 11/06/22  1957     History  Chief Complaint  Patient presents with   Abdominal Pain    Gabriela Morgan is a 42 y.o. female.  The history is provided by the patient. The history is limited by a language barrier. A language interpreter was used Sanda Linger [phone]).  Abdominal Pain Pain location: ovaries. Onset quality:  Gradual Duration:  1 day Associated symptoms: vaginal bleeding   Associated symptoms: no dysuria, no fever and no vomiting    She reports "ovary" pain and vag bleeding starting in the morning LMP 10/15/23   Previous history of appendectomy Home Medications Prior to Admission medications   Medication Sig Start Date End Date Taking? Authorizing Provider  ibuprofen (ADVIL) 600 MG tablet Take 1 tablet (600 mg total) by mouth every 6 (six) hours as needed for up to 30 doses. 01/24/22   Darlina Rumpf, CNM  Prenatal Vit-Fe Fumarate-FA (PRENATAL MULTIVITAMIN) TABS tablet Take 1 tablet by mouth daily at 12 noon. Patient not taking: Reported on 06/07/2020    [provider]      Allergies    Patient has no known allergies.    Review of Systems   Review of Systems  Constitutional:  Negative for fever.  Gastrointestinal:  Positive for abdominal pain. Negative for vomiting.  Genitourinary:  Positive for vaginal bleeding. Negative for dysuria.    Physical Exam Updated Vital Signs BP 112/69   Pulse 83   Temp 98.8 F (37.1 C)   Resp 18   LMP 12/04/2021   SpO2 99%  Physical Exam CONSTITUTIONAL: Well developed/well nourished, resting comfortably HEAD: Normocephalic/atraumatic EYES: EOMI/PERRL ENMT: Mucous membranes moist NECK: supple no meningeal signs CV: S1/S2 noted LUNGS: Lungs are clear to auscultation bilaterally, no apparent distress ABDOMEN: soft, nontender, no rebound or guarding, bowel sounds noted  throughout abdomen GU: Bilateral cva tenderness NEURO: Pt is awake/alert/appropriate, moves all extremitiesx4.  No facial droop.   EXTREMITIES: pulses normal/equal, full ROM SKIN: warm, color normal PSYCH: no abnormalities of mood noted, alert and oriented to situation  ED Results / Procedures / Treatments   Labs (all labs ordered are listed, but only abnormal results are displayed) Labs Reviewed  COMPREHENSIVE METABOLIC PANEL - Abnormal; Notable for the following components:      Result Value   Glucose, Bld 113 (*)    Calcium 8.6 (*)    Total Bilirubin <0.1 (*)    All other components within normal limits  CBC - Abnormal; Notable for the following components:   MCH 25.8 (*)    All other components within normal limits  URINALYSIS, ROUTINE W REFLEX MICROSCOPIC - Abnormal; Notable for the following components:   APPearance HAZY (*)    Hgb urine dipstick LARGE (*)    Ketones, ur 5 (*)    Protein, ur 30 (*)    All other components within normal limits  LIPASE, BLOOD  I-STAT BETA HCG BLOOD, ED ([facility], [facility], [facility] ONLY)    EKG None  Radiology US Pelvis Complete  Result Date: 11/06/2022 CLINICAL DATA:  Left-sided pain. EXAM: TRANSABDOMINAL AND TRANSVAGINAL ULTRASOUND OF PELVIS DOPPLER ULTRASOUND OF OVARIES TECHNIQUE: Both transabdominal and transvaginal ultrasound examinations of the pelvis were performed. Transabdominal technique was performed for global imaging of the pelvis including uterus, ovaries, adnexal regions, and pelvic cul-de-sac. It was necessary to proceed with endovaginal exam following the transabdominal exam to  visualize the endometrium and ovaries. Color and duplex Doppler ultrasound was utilized to evaluate blood flow to the ovaries. COMPARISON:  None Available. FINDINGS: Uterus Measurements: 9.0 x 4.7 x 6.2 cm = volume: 135 mL. The uterus is anteverted. The uterus is slightly heterogeneous. There is a 1.8 x 1.0 x 1.2 cm posterior fundal fibroid. Endometrium Thickness: 9 mm.   No focal abnormality visualized. Right ovary Measurements: 2.4 x 1.3 x 2.5 cm = volume: 4.0 mL. Normal appearance/no adnexal mass. Left ovary Measurements: 3.5 x 2.0 x 2.4 cm = volume: 8.7 mL. There is a 2 cm cyst in the left ovary. No imaging follow-up. Pulsed Doppler evaluation of both ovaries demonstrates normal low-resistance arterial and venous waveforms. Other findings No abnormal free fluid. IMPRESSION: Small posterior fundal fibroid, otherwise unremarkable pelvic ultrasound. Electronically Signed   By: Anner Crete M.D.   On: 11/06/2022 21:50   US Transvaginal Non-OB  Result Date: 11/06/2022 CLINICAL DATA:  Left-sided pain. EXAM: TRANSABDOMINAL AND TRANSVAGINAL ULTRASOUND OF PELVIS DOPPLER ULTRASOUND OF OVARIES TECHNIQUE: Both transabdominal and transvaginal ultrasound examinations of the pelvis were performed. Transabdominal technique was performed for global imaging of the pelvis including uterus, ovaries, adnexal regions, and pelvic cul-de-sac. It was necessary to proceed with endovaginal exam following the transabdominal exam to visualize the endometrium and ovaries. Color and duplex Doppler ultrasound was utilized to evaluate blood flow to the ovaries. COMPARISON:  None Available. FINDINGS: Uterus Measurements: 9.0 x 4.7 x 6.2 cm = volume: 135 mL. The uterus is anteverted. The uterus is slightly heterogeneous. There is a 1.8 x 1.0 x 1.2 cm posterior fundal fibroid. Endometrium Thickness: 9 mm.  No focal abnormality visualized. Right ovary Measurements: 2.4 x 1.3 x 2.5 cm = volume: 4.0 mL. Normal appearance/no adnexal mass. Left ovary Measurements: 3.5 x 2.0 x 2.4 cm = volume: 8.7 mL. There is a 2 cm cyst in the left ovary. No imaging follow-up. Pulsed Doppler evaluation of both ovaries demonstrates normal low-resistance arterial and venous waveforms. Other findings No abnormal free fluid. IMPRESSION: Small posterior fundal fibroid, otherwise unremarkable pelvic ultrasound. Electronically Signed    By: Anner Crete M.D.   On: 11/06/2022 21:50   [place] Art/Ven Flow Abd Pelv Doppler  Result Date: 11/06/2022 CLINICAL DATA:  Left-sided pain. EXAM: TRANSABDOMINAL AND TRANSVAGINAL ULTRASOUND OF PELVIS DOPPLER ULTRASOUND OF OVARIES TECHNIQUE: Both transabdominal and transvaginal ultrasound examinations of the pelvis were performed. Transabdominal technique was performed for global imaging of the pelvis including uterus, ovaries, adnexal regions, and pelvic cul-de-sac. It was necessary to proceed with endovaginal exam following the transabdominal exam to visualize the endometrium and ovaries. Color and duplex Doppler ultrasound was utilized to evaluate blood flow to the ovaries. COMPARISON:  None Available. FINDINGS: Uterus Measurements: 9.0 x 4.7 x 6.2 cm = volume: 135 mL. The uterus is anteverted. The uterus is slightly heterogeneous. There is a 1.8 x 1.0 x 1.2 cm posterior fundal fibroid. Endometrium Thickness: 9 mm.  No focal abnormality visualized. Right ovary Measurements: 2.4 x 1.3 x 2.5 cm = volume: 4.0 mL. Normal appearance/no adnexal mass. Left ovary Measurements: 3.5 x 2.0 x 2.4 cm = volume: 8.7 mL. There is a 2 cm cyst in the left ovary. No imaging follow-up. Pulsed Doppler evaluation of both ovaries demonstrates normal low-resistance arterial and venous waveforms. Other findings No abnormal free fluid. IMPRESSION: Small posterior fundal fibroid, otherwise unremarkable pelvic ultrasound. Electronically Signed   By: Anner Crete M.D.   On: 11/06/2022 21:50    Procedures Procedures  Medications Ordered in ED Medications  ibuprofen (ADVIL) tablet 400 mg (400 mg Oral Given 11/07/22 0033)    ED Course/ Medical Decision Making/ A&P                             Medical Decision Making Risk Prescription drug management.   This patient presents to the ED for concern of abdominal pain, this involves an extensive number of treatment options, and is a complaint that carries with it a high  risk of complications and morbidity.  The differential diagnosis includes but is not limited to cholecystitis, cholelithiasis, pancreatitis, gastritis, peptic ulcer disease, appendicitis, bowel obstruction, bowel perforation, diverticulitis, AAA, ischemic bowel, ectopic pregnancy, TOA, PID, torsion  Social Determinants of Health: Patient's  English is a second language   increases the complexity of managing their presentation, also lack of insurance  Lab Tests: I Ordered, and personally interpreted labs.  The pertinent results include: Labs overall unremarkable  Imaging Studies ordered: I ordered imaging studies including Ultrasound pelvic   I independently visualized and interpreted imaging which showed no acute findings, fibroid I agree with the radiologist interpretation  Medicines ordered and prescription drug management: I ordered medication including ibuprofen for pain  Complexity of problems addressed: Patient's presentation is most consistent with  acute presentation with potential threat to life or bodily function  Disposition: After consideration of the diagnostic results and the patient's response to treatment,  I feel that the patent would benefit from discharge   .   By the time of my evaluation, all of her workup was complete and unremarkable except for a fibroid.  This is likely contributing to her pain and bleeding.  She is in no acute distress.  Will defer pelvic exam for now, refer to gynecology. I have low suspicion for acute abdominal emergency        Final Clinical Impression(s) / ED Diagnoses Final diagnoses:  Left lower quadrant abdominal pain  Uterine leiomyoma, unspecified location    Rx / DC Orders ED Discharge Orders     None         Ripley Fraise, MD 11/07/22 [id number]

## 2023-01-14 ENCOUNTER — Other Ambulatory Visit: Payer: Self-pay

## 2023-01-14 ENCOUNTER — Emergency Department (HOSPITAL_COMMUNITY)
Admission: EM | Admit: 2023-01-14 | Discharge: 2023-01-14 | Disposition: A | Discharge status: Home / Self Care | Payer: Self-pay | Attending: Emergency Medicine | Admitting: Emergency Medicine

## 2023-01-14 ENCOUNTER — Emergency Department (HOSPITAL_COMMUNITY): Payer: Self-pay

## 2023-01-14 ENCOUNTER — Encounter (HOSPITAL_COMMUNITY): Payer: Self-pay

## 2023-01-14 DIAGNOSIS — R0781 Pleurodynia: Secondary | ICD-10-CM | POA: Insufficient documentation

## 2023-01-14 MED ORDER — LIDOCAINE 5 % EX PTCH: 1.0000 | MEDICATED_PATCH | CUTANEOUS | 0 refills | Status: AC

## 2023-01-14 MED ORDER — MELOXICAM 15 MG PO TABS: 15.0000 mg | ORAL_TABLET | Freq: Every day | ORAL | 0 refills | Status: DC

## 2023-01-14 NOTE — ED Provider Triage Note (Signed)
Emergency Medicine Provider Triage Evaluation Note  Gabriela Morgan , a 42 y.o. female  was evaluated in triage.  Pt complains of left-sided chest tenderness after a fall.  Patient states she fell approximately 7 days ago.  She has continued tenderness just under the left breast since falling.  She endorses hitting that side of her rib cage when she fell.  She endorses pain with deep inspiration.  Review of Systems  Positive: As above Negative: As above  Physical Exam  There were no vitals taken for this visit. Gen:   Awake, no distress   Resp:  Normal effort  MSK:   Moves extremities without difficulty  Other:  Left side of chest TTP just below left breast  Medical Decision Making  Medically screening exam initiated at 5:15 PM.  Appropriate orders placed.  Eileen Stanford was informed that the remainder of the evaluation will be completed by another provider, this initial triage assessment does not replace that evaluation, and the importance of remaining in the ED until their evaluation is complete.     Darrick Grinder, PA-C 01/14/23 1720

## 2023-01-14 NOTE — Discharge Instructions (Addendum)
As we discussed, your workup in the ER today was reassuring for acute findings.  X-ray imaging of your rib cage did not reveal any fracture or dislocation or any underlying injuries.  This does not definitively rule out a broken rib, however given that management of nondisplaced rib fractures is just supportive care, there is no indication to proceed with a CT scan. I have given you a prescription for lidocaine patch and anti-inflammatory medication for you to take as prescribed as needed.  You may also add Tylenol to this regimen.  Icing the area may also give you some relief.  Return if development of any new or worsening symptoms.  Como comentamos, su examen de hoy en urgencias fue tranquilizador por los [employer]. Las imgenes de rayos X de la caja torcica no revelaron ninguna fractura, dislocacin ni lesiones subyacentes. Esto no descarta definitivamente una costilla rota; sin embargo, dado que el tratamiento de las fracturas costales no desplazadas es solo atencin de apoyo, no hay indicacin para proceder con una tomografa computarizada. Le he recetado un parche de lidocana y un medicamento antiinflamatorio para que los tome segn lo prescrito segn sea necesario. Tambin puede agregar Tylenol a este rgimen. Aplicar hielo en el rea tambin puede brindarle algo de [place].  Regrese si desarrolla algn sntoma nuevo o que empeora.

## 2023-01-14 NOTE — ED Notes (Signed)
Pt ambulatory to room with steady gait c/o left sided rib cage pain under breast after fall 8 days ago. Pt states it is tender to palpation and hurts to take a deep breathe. Pt a/o x 4 respirations even and non labored oxygen 100% on room air lungs cta

## 2023-01-14 NOTE — ED Triage Notes (Signed)
PT arrives POV ambulatory with a c/o of falling 8 days ago, hitting her chest and having pain ever since.Pain is under left breast and hurts when she takes a deep breath

## 2023-01-14 NOTE — ED Provider Notes (Signed)
[facility] [facility] EMERGENCY DEPARTMENT AT [facility] Provider Note   CSN: [id number] Arrival date & time: 01/14/23  1710     History  Chief Complaint  Patient presents with   Chest Pain    Left side under left breast    Gabriela Morgan is a 42 y.o. female.  Patient with noncontributory past medical history presents today with complaints of fall. She states that same occurred 7 days ago when she slipped on the bathroom floor that was wet and struck her left ribcage on the ground.  She did not hit her head or lose consciousness.  She is not anticoagulated.  Pain is isolated to the left lower ribs and has been persistent since onset.  Pain does not radiate.  She does endorse pain with deep breathing.  Has not tried anything for symptoms.  The history is provided by the patient. No language interpreter was used.  Chest Pain      Home Medications Prior to Admission medications   Medication Sig Start Date End Date Taking? Authorizing Provider  ibuprofen (ADVIL) 600 MG tablet Take 1 tablet (600 mg total) by mouth every 6 (six) hours as needed for up to 30 doses. 01/24/22   Calvert Cantor, CNM  Prenatal Vit-Fe Fumarate-FA (PRENATAL MULTIVITAMIN) TABS tablet Take 1 tablet by mouth daily at 12 noon. Patient not taking: Reported on 06/07/2020    [provider]      Allergies    Patient has no known allergies.    Review of Systems   Review of Systems  Cardiovascular:  Positive for chest pain.  All other systems reviewed and are negative.   Physical Exam Updated Vital Signs BP 120/78 (BP Location: Right Arm)   Pulse 80   Temp 98.7 F (37.1 C) (Oral)   Resp 18   Ht  (1.549 m)   Wt 66.4 kg   SpO2 100%   BMI 27.66 kg/m  Physical Exam Vitals and nursing note reviewed.  Constitutional:      General: She is not in acute distress.    Appearance: Normal appearance. She is normal weight. She is not ill-appearing, toxic-appearing or  diaphoretic.  HENT:     Head: Normocephalic and atraumatic.  Cardiovascular:     Rate and Rhythm: Normal rate and regular rhythm.     Heart sounds: Normal heart sounds.  Pulmonary:     Effort: Pulmonary effort is normal. No respiratory distress.     Breath sounds: Normal breath sounds.  Chest:       Comments: Tenderness to palpation to the ribcage underneath the left breast with slight bruising present that is healing. No crepitus or deformity.  Abdominal:     Palpations: Abdomen is soft.     Tenderness: There is no abdominal tenderness.  Musculoskeletal:        General: Normal range of motion.     Cervical back: Normal range of motion.  Skin:    General: Skin is warm and dry.  Neurological:     General: No focal deficit present.     Mental Status: She is alert.  Psychiatric:        Mood and Affect: Mood normal.        Behavior: Behavior normal.     ED Results / Procedures / Treatments   Labs (all labs ordered are listed, but only abnormal results are displayed) Labs Reviewed - No data to display  EKG None  Radiology DG Ribs Unilateral W/Chest  Left  Result Date: 01/14/2023 CLINICAL DATA:  Left-sided rib pain after fall EXAM: LEFT RIBS AND CHEST - 3 VIEW COMPARISON:  None Available. FINDINGS: No consolidation, pneumothorax or effusion. No edema. Normal cardiopericardial silhouette without edema. No left-sided rib fracture identified. IMPRESSION: No rib fracture seen.  No pneumothorax. Electronically Signed   By: Karen Kays M.D.   On: 01/14/2023 17:43    Procedures Procedures    Medications Ordered in ED Medications - No data to display  ED Course/ Medical Decision Making/ A&P                             Medical Decision Making  Patient presents today with complaints of left sided ribcage pain after fall 7 days ago. She is afebrile, nontoxic-appearing, and in no acute distress with reassuring vital signs.  Physical exam does reveal some tenderness to palpation  over the left rib cage underneath the breast.  No crepitus or deformity.  Trace bruising that is healing.  X-ray imaging obtained which revealed no rib fracture seen, no pneumothorax.  I have personally reviewed and interpreted this imaging and agree with radiology interpretation.  I discussed with the patient that this does not definitively rule out a rib fracture, however given patient's time since the injury with vital signs that are stable, there is no indication to further evaluate with CT imaging.  Discussed with patient who is understanding and in agreement.  Plan to give lidocaine patches and anti-inflammatory medication to help with symptoms. Evaluation and diagnostic testing in the emergency department does not suggest an emergent condition requiring admission or immediate intervention beyond what has been performed at this time.  Plan for discharge with close PCP follow-up.  Patient is understanding and amenable with plan, educated on red flag symptoms that would prompt immediate return.  Patient discharged in stable condition.   Final Clinical Impression(s) / ED Diagnoses Final diagnoses:  Rib pain on left side    Rx / DC Orders ED Discharge Orders          Ordered    lidocaine (LIDODERM) 5 %  Every 24 hours        01/14/23 2243    meloxicam (MOBIC) 15 MG tablet  Daily        01/14/23 2243          An After Visit Summary was printed and given to the patient.     Vear Clock 01/14/23 2250    Lonell Grandchild, MD 01/14/23 [phone]

## 2024-03-20 ENCOUNTER — Encounter (HOSPITAL_COMMUNITY): Payer: Self-pay | Admitting: *Deleted

## 2024-03-20 ENCOUNTER — Inpatient Hospital Stay (HOSPITAL_COMMUNITY): Payer: Self-pay

## 2024-03-20 ENCOUNTER — Inpatient Hospital Stay (HOSPITAL_COMMUNITY)
Admission: AD | Admit: 2024-03-20 | Discharge: 2024-03-20 | Disposition: A | Discharge status: Home / Self Care | Payer: Self-pay | Attending: Obstetrics and Gynecology | Admitting: Obstetrics and Gynecology

## 2024-03-20 ENCOUNTER — Other Ambulatory Visit: Payer: Self-pay

## 2024-03-20 DIAGNOSIS — Z603 Acculturation difficulty: Secondary | ICD-10-CM | POA: Insufficient documentation

## 2024-03-20 DIAGNOSIS — O26891 Other specified pregnancy related conditions, first trimester: Secondary | ICD-10-CM | POA: Insufficient documentation

## 2024-03-20 DIAGNOSIS — R110 Nausea: Secondary | ICD-10-CM | POA: Insufficient documentation

## 2024-03-20 DIAGNOSIS — O3680X0 Pregnancy with inconclusive fetal viability, not applicable or unspecified: Secondary | ICD-10-CM | POA: Insufficient documentation

## 2024-03-20 DIAGNOSIS — O209 Hemorrhage in early pregnancy, unspecified: Secondary | ICD-10-CM | POA: Insufficient documentation

## 2024-03-20 DIAGNOSIS — Z3A01 Less than 8 weeks gestation of pregnancy: Secondary | ICD-10-CM | POA: Insufficient documentation

## 2024-03-20 DIAGNOSIS — O4690 Antepartum hemorrhage, unspecified, unspecified trimester: Secondary | ICD-10-CM

## 2024-03-20 LAB — CBC
HCT: 38.6 % (ref 36.0–46.0)
Hemoglobin: 12.1 g/dL (ref 12.0–15.0)
MCH: 25.5 pg — ABNORMAL LOW (ref 26.0–34.0)
MCHC: 31.3 g/dL (ref 30.0–36.0)
MCV: 81.4 fL (ref 80.0–100.0)
Platelets: 268 10*3/uL (ref 150–400)
RBC: 4.74 MIL/uL (ref 3.87–5.11)
RDW: 13.8 % (ref 11.5–15.5)
WBC: 8.7 10*3/uL (ref 4.0–10.5)
nRBC: 0 % (ref 0.0–0.2)

## 2024-03-20 LAB — HIV ANTIBODY (ROUTINE TESTING W REFLEX): HIV Screen 4th Generation wRfx: NONREACTIVE

## 2024-03-20 LAB — WET PREP, GENITAL
Clue Cells Wet Prep HPF POC: NONE SEEN
Sperm: NONE SEEN
Trich, Wet Prep: NONE SEEN
WBC, Wet Prep HPF POC: <10 (ref <10)
Yeast Wet Prep HPF POC: NONE SEEN

## 2024-03-20 LAB — ABO/RH: ABO/RH(D): B POS

## 2024-03-20 LAB — HCG, QUANTITATIVE, PREGNANCY: hCG, Beta Chain, Quant, S: 796 m[IU]/mL — ABNORMAL HIGH (ref <5)

## 2024-03-20 LAB — POC URINE PREG, ED: Preg Test, Ur: POSITIVE — AB

## 2024-03-20 LAB — RPR: RPR Ser Ql: NONREACTIVE

## 2024-03-20 MED ORDER — PROMETHAZINE HCL 25 MG PO TABS: 25.0000 mg | ORAL_TABLET | Freq: Four times a day (QID) | ORAL | 0 refills | Status: AC | PRN

## 2024-03-20 NOTE — MAU Provider Note (Signed)
 History     CSN: [id number]  Arrival date and time: 03/20/24 0149   Event Date/Time   First Provider Initiated Contact with Patient 03/20/24 [phone]      Chief Complaint  Patient presents with   Vaginal Bleeding   HPI [name] is a 43 y.o. year old [id number] female at [redacted]w[redacted]d weeks gestation by approximate LMP who presents to MAU from [facility] reporting (+) HPT today, abdominal pain and vaginal bleeding since yesterday. She describes the bleeding as light to moderate in amount. She is having to change pads every 3 hours. She is also having some occasional  lower abdominal cramping; none now. She reports nausea without vomiting. Her spouse is present and contributing to the history taking.      OB History     Gravida  7   Para  4   Term  2   Preterm  2   AB  1   Living  4      SAB  1   IAB  0   Ectopic  0   Multiple  0   Live Births  4        Obstetric Comments  2014 approx 28 weeks 2010 approx 30 weeks         Past Medical History:  Diagnosis Date   Anemia    Hyperemesis    Preterm labor    Uterine fibroid     Past Surgical History:  Procedure Laterality Date   APPENDECTOMY  2015   in [place]    Family History  Problem Relation Age of Onset   Diabetes Mother    Diabetes Brother    Healthy Father     Social History   Tobacco Use   Smoking status: Never   Smokeless tobacco: Never  Vaping Use   Vaping status: Never Used  Substance Use Topics   Alcohol use: Not Currently   Drug use: Not Currently    Allergies: No Known Allergies  Medications Prior to Admission  Medication Sig Dispense Refill Last Dose/Taking   ibuprofen  (ADVIL ) 600 MG tablet Take 1 tablet (600 mg total) by mouth every 6 (six) hours as needed for up to 30 doses. 30 tablet 0    lidocaine  (LIDODERM ) 5 % Place 1 patch onto the skin daily. Remove & Discard patch within 12 hours or as directed by MD 30 patch 0    meloxicam  (MOBIC ) 15 MG tablet Take 1  tablet (15 mg total) by mouth daily. 15 tablet 0    Prenatal Vit-Fe Fumarate-FA (PRENATAL MULTIVITAMIN) TABS tablet Take 1 tablet by mouth daily at 12 noon. (Patient not taking: Reported on 06/07/2020)       Review of Systems  Constitutional: Negative.   HENT: Negative.    Eyes: Negative.   Respiratory: Negative.    Cardiovascular: Negative.   Gastrointestinal:  Positive for nausea.  Endocrine: Negative.   Genitourinary:  Positive for vaginal bleeding. Negative for pelvic pain (none now).  Musculoskeletal: Negative.   Skin: Negative.   Allergic/Immunologic: Negative.   Neurological: Negative.   Hematological: Negative.   Psychiatric/Behavioral: Negative.     Physical Exam   Blood pressure 127/85, pulse 75, temperature 98 F (36.7 C), resp. rate 16, last menstrual period 02/04/2024, SpO2 100%, unknown if currently breastfeeding.  Physical Exam Vitals and nursing note reviewed.  Constitutional:      Appearance: Normal appearance. She is normal weight.   Cardiovascular:     Rate and Rhythm: Normal  rate.  Pulmonary:     Effort: Pulmonary effort is normal.  Genitourinary:    Comments: Swabs collected by patient using blind swab technique   Musculoskeletal:        General: Normal range of motion.   Neurological:     Mental Status: She is alert and oriented to person, place, and time.   Psychiatric:        Mood and Affect: Mood normal.        Behavior: Behavior normal.        Thought Content: Thought content normal.        Judgment: Judgment normal.    MAU Course  Procedures  MDM CCUA UPT CBC ABO/Rh HCG Wet Prep GC/CT -- Results pending  RPR -- Results pending  OB U/S < 14 wks TVUS  Results for orders placed or performed during the hospital encounter of 03/20/24 (from the past 24 hours)  POC Urine Pregnancy, ED (not at [facility] or [facility])     Status: Abnormal   Collection Time: 03/20/24  2:23 AM  Result Value Ref Range   Preg Test, Ur POSITIVE (A) NEGATIVE  CBC      Status: Abnormal   Collection Time: 03/20/24  3:11 AM  Result Value Ref Range   WBC 8.7 4.0 - 10.5 K/uL   RBC 4.74 3.87 - 5.11 MIL/uL   Hemoglobin 12.1 12.0 - 15.0 g/dL   HCT 61.3 63.9 - 53.9 %   MCV 81.4 80.0 - 100.0 fL   MCH 25.5 (L) 26.0 - 34.0 pg   MCHC 31.3 30.0 - 36.0 g/dL   RDW 86.1 88.4 - 84.4 %   Platelets 268 150 - 400 K/uL   nRBC 0.0 0.0 - 0.2 %  ABO/Rh     Status: None   Collection Time: 03/20/24  3:11 AM  Result Value Ref Range   ABO/RH(D) B POS    No rh immune globuloin      NOT A RH IMMUNE GLOBULIN CANDIDATE, PT RH POSITIVE Performed at [facility], [address] [place], [place] [address]   hCG, quantitative, pregnancy     Status: Abnormal   Collection Time: 03/20/24  3:11 AM  Result Value Ref Range   hCG, Beta Chain, Quant, S 796 (H) <5 mIU/mL  Wet prep, genital     Status: None   Collection Time: 03/20/24  3:15 AM   Specimen: PATH Cytology Cervicovaginal Ancillary Only  Result Value Ref Range   Yeast Wet Prep HPF POC NONE SEEN NONE SEEN   Trich, Wet Prep NONE SEEN NONE SEEN   Clue Cells Wet Prep HPF POC NONE SEEN NONE SEEN   WBC, Wet Prep HPF POC <10 <10   Sperm NONE SEEN     US  OB LESS THAN 14 WEEKS WITH OB TRANSVAGINAL Result Date: 03/20/2024 CLINICAL DATA:  43 year old female with pelvic pain and vaginal bleeding in the 1st trimester of pregnancy. Estimated gestational age by LMP is 6 weeks and 3 days. Quantitative beta HCG 796. EXAM: OBSTETRIC <14 WK US  AND TRANSVAGINAL OB US  TECHNIQUE: Both transabdominal and transvaginal ultrasound examinations were performed for complete evaluation of the gestation as well as the maternal uterus, adnexal regions, and pelvic cul-de-sac. Transvaginal technique was performed to assess early pregnancy. COMPARISON:  Pelvis ultrasound 11/06/2022. FINDINGS: Intrauterine gestational sac: None. Maternal uterus/adnexae: Uterine fundal fibroid measuring about 2.5 cm redemonstrated on series 1, image 7. Endometrium appears  bland throughout, thickened up to 13 mm. Left ovary appears normal measuring  2.2 x 1.2 x 1.2 cm (2 mL). Right ovary remarkable for a small 8-9 mm cystic appearing structure (series 1, image 41) with no surrounding hypervascularity. The right ovary is 3.8 x 1.8 x 1.5 cm (5 mL). No pelvis free fluid is identified. IMPRESSION: No IUP, suspicious adnexal mass, or pelvic free fluid identified. Differential diagnosis includes early IUP, failed IUP, and occult ectopic pregnancy. Recommend serial quantitative beta HCG and repeat ultrasound as necessary. Electronically Signed   By: VEAR Hurst M.D.   On: 03/20/2024 05:29    Assessment and Plan  1. Vaginal bleeding in pregnancy (Primary) - Information provided on vaginal bleeding in 1st trimester - Discussed SAB precautions - Return to MAU: If you have heavier bleeding that soaks through more that 2 pads per hour for an hour or more If you bleed so much that you feel like you might pass out or you do pass out If you have significant abdominal pain that is not improved with Tylenol  1000 mg every 8 hours as needed for pain If you develop a fever > 100.5   2. Pregnancy of unknown anatomic location - Explained that although she is pregnant, we do not know the location or livelihood of this pregnancy - Explained that she will need to have HCG repeated in 48 hours - Explained she could possibly have a repeat U/S in 7-10 days if HCG level doubles in 48 hours  3. Nausea - Information provided on nausea - Prescription for: Phenergan  25 mg po every 6 hours prn nausea and/or vomiting   4. Language barrier affecting health care  5. [redacted] weeks gestation of pregnancy    - Return to MAU on Wednesday 03/22/2024 for repeat HCG - Patient verbalized an understanding of the plan of care and agrees.   Ala Cart, CNM 03/20/2024, 5:46 AM

## 2024-03-20 NOTE — ED Triage Notes (Signed)
  Positive pregnancy test  Yesterday with vaginal bleeding Today increased bleeding and abd pain  Lmp 02/04/23

## 2024-03-20 NOTE — MAU Note (Signed)
 MAU Triage Note:  .Gabriela Morgan Gabriela Morgan is a 43 y.o. at Unknown here in MAU reporting: sent from ED after going in for abdominal pain and vaginal bleeding. Pt reports light to moderate amount of bleeding and is changing pads every 3 hours. Bleeding started yesterday. She is also reporting lower abdominal cramping that is intermittent, but denies pain at this time.  Patient complaint: abd pain and bleeding   Denies pain     Onset of complaint: yesterday LMP: Patient's last menstrual period was 02/04/2024.  Vitals:   03/20/24 0153  BP: 127/85  Pulse: 75  Resp: 16  Temp: 98 F (36.7 C)  SpO2: 100%     Lab orders placed from triage: N/A

## 2024-03-20 NOTE — Discharge Instructions (Addendum)
 Puede tomar Tylenol  100 mg por va oral cada 8 horas segn sea necesario para el dolor.  Le han recetado Phenergan  para las nuseas. Si no puede tragar las pastillas debido a las nuseas, puede ingerirlas en la vagina.  Regresar a MAU:  Si tiene un sangrado ms intenso que empapa ms de 2 toallas sanitarias por hora durante una hora o ms  Si sangra tanto que siente que podra desmayarse o se desmaya  Si tiene dolor abdominal significativo que no mejora con Tylenol  1000 mg cada 8 horas segn sea necesario para [employer].  Si presenta fiebre > 100.5

## 2024-03-20 NOTE — ED Provider Triage Note (Signed)
 Emergency Medicine Provider OB Triage Evaluation Note  Gabriela Morgan is a 43 y.o. female, [phone], at Unknown gestation who presents to the emergency department with complaints of vaginal bleeding and lower abdominal pain in early pregnancy, no prenatal care. Reports onset of symptoms tonight. G6? LMP May 2025.  Review of  Systems  Positive:  Negative:   Physical Exam  BP 127/85 (BP Location: Right Arm)   Pulse 75   Temp 98 F (36.7 C)   Resp 16   LMP 02/04/2024   SpO2 100%  General: Awake, no distress  HEENT: Atraumatic  Resp: Normal effort  Cardiac: Normal rate Abd: Nondistended, mild lower abdominal discomfort with palpation  MSK: Moves all extremities without difficulty Neuro: Speech clear  Medical Decision Making  Pt evaluated for pregnancy concern and is stable for transfer to MAU. Pt is in agreement with plan for transfer.  2:33 AM Discussed with MAU Dr. Cleatus, who accepts patient in transfer.  Clinical Impression  No diagnosis found.     Beverley Leita LABOR, PA-C 03/20/24 [phone]

## 2024-03-21 LAB — GC/CHLAMYDIA PROBE AMP (~~LOC~~) NOT AT ARMC
Chlamydia: NEGATIVE
Comment: NEGATIVE
Comment: NORMAL
Neisseria Gonorrhea: NEGATIVE

## 2024-03-22 ENCOUNTER — Inpatient Hospital Stay (HOSPITAL_COMMUNITY)
Admission: AD | Admit: 2024-03-22 | Discharge: 2024-03-22 | Disposition: A | Discharge status: Home / Self Care | Payer: Self-pay | Attending: Obstetrics and Gynecology | Admitting: Obstetrics and Gynecology

## 2024-03-22 ENCOUNTER — Inpatient Hospital Stay (HOSPITAL_COMMUNITY)
Admit: 2024-03-22 | Discharge: 2024-03-22 | Disposition: A | Discharge status: Home / Self Care | Payer: Self-pay | Attending: Obstetrics and Gynecology | Admitting: Obstetrics and Gynecology

## 2024-03-22 DIAGNOSIS — Z3A01 Less than 8 weeks gestation of pregnancy: Secondary | ICD-10-CM

## 2024-03-22 DIAGNOSIS — O039 Complete or unspecified spontaneous abortion without complication: Secondary | ICD-10-CM

## 2024-03-22 LAB — HCG, QUANTITATIVE, PREGNANCY: hCG, Beta Chain, Quant, S: 187 m[IU]/mL — ABNORMAL HIGH (ref <5)

## 2024-03-22 NOTE — MAU Note (Signed)
 Here for f/u  HCG. Denies pain or bleeding at this time. BP 125/66   Pulse 70   Temp 98.4 F (36.9 C)   Resp 18   Ht 5' 2.99 (1.6 m)   Wt 64.9 kg   LMP 02/04/2024   BMI 25.34 kg/m

## 2024-03-22 NOTE — MAU Provider Note (Signed)
 Chief Complaint: Follow-up   Event Date/Time   First Provider Initiated Contact with Patient 03/22/24 1700      SUBJECTIVE HPI: Gabriela Morgan is a 43 y.o. [id number] at [redacted]w[redacted]d by LMP who presents to maternity admissions for repeat hcg. She denies any pain or bleeding today.  She initially presented on 03/20/24 with abdominal pain and bleeding and had hcg of 796 and US  without evidence of IUP or ectopic pregnancy.    HPI  Past Medical History:  Diagnosis Date   Anemia    Hyperemesis    Preterm labor    Uterine fibroid    Past Surgical History:  Procedure Laterality Date   APPENDECTOMY  2015   in [place]   Social History   Socioeconomic History   Marital status: Single    Spouse name: Not on file   Number of children: Not on file   Years of education: Not on file   Highest education level: Not on file  Occupational History   Not on file  Tobacco Use   Smoking status: Never   Smokeless tobacco: Never  Vaping Use   Vaping status: Never Used  Substance and Sexual Activity   Alcohol use: Not Currently   Drug use: Not Currently   Sexual activity: Not Currently    Birth control/protection: None  Other Topics Concern   Not on file  Social History Narrative   Not on file   Social Drivers of Health   Financial Resource Strain: Not on file  Food Insecurity: No Food Insecurity (06/07/2020)   Hunger Vital Sign    Worried About Running Out of Food in the Last Year: Never true    Ran Out of Food in the Last Year: Never true  Transportation Needs: No Transportation Needs (06/07/2020)   PRAPARE - Administrator, Civil Service (Medical): No    Lack of Transportation (Non-Medical): No  Physical Activity: Not on file  Stress: Not on file  Social Connections: Not on file  Intimate Partner Violence: Not on file   No current facility-administered medications on file prior to encounter.   Current Outpatient Medications on File Prior to Encounter   Medication Sig Dispense Refill   lidocaine  (LIDODERM ) 5 % Place 1 patch onto the skin daily. Remove & Discard patch within 12 hours or as directed by MD 30 patch 0   Prenatal Vit-Fe Fumarate-FA (PRENATAL MULTIVITAMIN) TABS tablet Take 1 tablet by mouth daily at 12 noon. (Patient not taking: Reported on 06/07/2020)     promethazine  (PHENERGAN ) 25 MG tablet Take 1 tablet (25 mg total) by mouth every 6 (six) hours as needed for nausea. You may insert the pill vaginally, if you are not able to swallow pills or unable to keep anything down. 30 tablet 0   No Known Allergies  ROS:  Review of Systems   I have reviewed patient's Past Medical Hx, Surgical Hx, Family Hx, Social Hx, medications and allergies.   Physical Exam  Patient Vitals for the past 24 hrs:  BP Temp Pulse Resp Height Weight  03/22/24 1349 125/66 98.4 F (36.9 C) 70 18 5' 2.99 (1.6 m) 64.9 kg   Constitutional: Well-developed, well-nourished female in no acute distress.  Cardiovascular: normal rate Respiratory: normal effort GI: Abd soft, non-tender. Pos BS x 4 MS: Extremities nontender, no edema, normal ROM Neurologic: Alert and oriented x 4.  GU: Neg CVAT.  PELVIC EXAM: Deferred  LAB RESULTS Results for orders placed or performed during the  hospital encounter of 03/22/24 (from the past 24 hours)  hCG, quantitative, pregnancy     Status: Abnormal   Collection Time: 03/22/24  1:55 PM  Result Value Ref Range   hCG, Beta Chain, Quant, S 187 (H) <5 mIU/mL    --/--/B POS (06/30 0311)  IMAGING US  OB LESS THAN 14 WEEKS WITH OB TRANSVAGINAL Result Date: 03/20/2024 CLINICAL DATA:  43 year old female with pelvic pain and vaginal bleeding in the 1st trimester of pregnancy. Estimated gestational age by LMP is 6 weeks and 3 days. Quantitative beta HCG 796. EXAM: OBSTETRIC <14 WK US  AND TRANSVAGINAL OB US  TECHNIQUE: Both transabdominal and transvaginal ultrasound examinations were performed for complete evaluation of the  gestation as well as the maternal uterus, adnexal regions, and pelvic cul-de-sac. Transvaginal technique was performed to assess early pregnancy. COMPARISON:  Pelvis ultrasound 11/06/2022. FINDINGS: Intrauterine gestational sac: None. Maternal uterus/adnexae: Uterine fundal fibroid measuring about 2.5 cm redemonstrated on series 1, image 7. Endometrium appears bland throughout, thickened up to 13 mm. Left ovary appears normal measuring 2.2 x 1.2 x 1.2 cm (2 mL). Right ovary remarkable for a small 8-9 mm cystic appearing structure (series 1, image 41) with no surrounding hypervascularity. The right ovary is 3.8 x 1.8 x 1.5 cm (5 mL). No pelvis free fluid is identified. IMPRESSION: No IUP, suspicious adnexal mass, or pelvic free fluid identified. Differential diagnosis includes early IUP, failed IUP, and occult ectopic pregnancy. Recommend serial quantitative beta HCG and repeat ultrasound as necessary. Electronically Signed   By: VEAR Hurst M.D.   On: 03/20/2024 05:29    MAU Management/MDM: Orders Placed This Encounter  Procedures   hCG, quantitative, pregnancy   Discharge patient Discharge disposition: 01-Home or Self Care; Discharge patient date: 03/22/2024    No orders of the defined types were placed in this encounter.   Hcg dropped significantly today, c/w SAB. Reviewed results with pt, questions answered.  Follow up established with [facility] for 1 week hcg repeat and 2 week provider visit. Return precautions given.    ASSESSMENT 1. SAB (spontaneous abortion)   2. [redacted] weeks gestation of pregnancy     PLAN Discharge home Allergies as of 03/22/2024   No Known Allergies      Medication List     TAKE these medications    lidocaine  5 % Commonly known as: Lidoderm  Place 1 patch onto the skin daily. Remove & Discard patch within 12 hours or as directed by MD   prenatal multivitamin Tabs tablet Take 1 tablet by mouth daily at 12 noon.   promethazine  25 MG tablet Commonly known as:  PHENERGAN  Take 1 tablet (25 mg total) by mouth every 6 (six) hours as needed for nausea. You may insert the pill vaginally, if you are not able to swallow pills or unable to keep anything down.        Follow-up Information     [facility] Follow up in 2 week(s).   Specialty: Obstetrics and Gynecology Why: Clint lose le llamar con ignacia vernie kansky y hora Contact information: [address] [place]  [address] [phone]                Olam Boards Certified Nurse-Midwife 03/22/2024  5:00 PM

## 2024-03-31 ENCOUNTER — Other Ambulatory Visit: Payer: Self-pay | Admitting: *Deleted

## 2024-03-31 ENCOUNTER — Other Ambulatory Visit: Payer: Self-pay

## 2024-03-31 DIAGNOSIS — O039 Complete or unspecified spontaneous abortion without complication: Secondary | ICD-10-CM

## 2024-04-06 ENCOUNTER — Encounter: Payer: Self-pay | Admitting: Student

## 2024-04-06 ENCOUNTER — Ambulatory Visit: Payer: Self-pay | Admitting: Student

## 2024-04-06 VITALS — BP 110/72 | HR 73 | Wt 145.6 lb

## 2024-04-06 DIAGNOSIS — Z758 Other problems related to medical facilities and other health care: Secondary | ICD-10-CM

## 2024-04-06 DIAGNOSIS — Z3A00 Weeks of gestation of pregnancy not specified: Secondary | ICD-10-CM

## 2024-04-06 DIAGNOSIS — Z3009 Encounter for other general counseling and advice on contraception: Secondary | ICD-10-CM

## 2024-04-06 DIAGNOSIS — O039 Complete or unspecified spontaneous abortion without complication: Secondary | ICD-10-CM

## 2024-04-06 DIAGNOSIS — Z603 Acculturation difficulty: Secondary | ICD-10-CM

## 2024-04-06 NOTE — Progress Notes (Signed)
 History:  [name] is a 43 y.o. [id number] who presents to clinic today for SAB follow-up.    The following portions of the patient's history were reviewed and updated as appropriate: allergies, current medications, family history, past medical history, social history, past surgical history and problem list.  Review of Systems:  ROS    Objective:  Physical Exam BP 110/72   Pulse 73   Wt 145 lb 9.6 oz (66 kg)   LMP 02/04/2024   Breastfeeding Unknown   BMI 25.80 kg/m  Physical Exam    Labs and Imaging  Recent Results (from the past 2160 hours)  POC Urine Pregnancy, ED (not at [facility] or [facility])     Status: Abnormal   Collection Time: 03/20/24  2:23 AM  Result Value Ref Range   Preg Test, Ur POSITIVE (A) NEGATIVE    Comment:        THE SENSITIVITY OF THIS METHODOLOGY IS >20 mIU/mL.   GC/Chlamydia probe amp ([place])not at [facility]     Status: None   Collection Time: 03/20/24  2:37 AM  Result Value Ref Range   Neisseria Gonorrhea Negative    Chlamydia Negative    Comment Normal Reference Ranger Chlamydia - Negative    Comment      Normal Reference Range Neisseria Gonorrhea - Negative  CBC     Status: Abnormal   Collection Time: 03/20/24  3:11 AM  Result Value Ref Range   WBC 8.7 4.0 - 10.5 K/uL   RBC 4.74 3.87 - 5.11 MIL/uL   Hemoglobin 12.1 12.0 - 15.0 g/dL   HCT 61.3 63.9 - 53.9 %   MCV 81.4 80.0 - 100.0 fL   MCH 25.5 (L) 26.0 - 34.0 pg   MCHC 31.3 30.0 - 36.0 g/dL   RDW 86.1 88.4 - 84.4 %   Platelets 268 150 - 400 K/uL   nRBC 0.0 0.0 - 0.2 %    Comment: Performed at [facility], [address], [place], [place] [address]  ABO/Rh     Status: None   Collection Time: 03/20/24  3:11 AM  Result Value Ref Range   ABO/RH(D) B POS    No rh immune globuloin      NOT A RH IMMUNE GLOBULIN CANDIDATE, PT RH POSITIVE Performed at [facility], [address], [place], [place] [address]   hCG, quantitative, pregnancy     Status: Abnormal    Collection Time: 03/20/24  3:11 AM  Result Value Ref Range   hCG, Beta Chain, Quant, S 796 (H) <5 mIU/mL    Comment:          GEST. AGE      CONC.  (mIU/mL)   <=1 WEEK        5 - 50     2 WEEKS       50 - 500     3 WEEKS       100 - 10,000     4 WEEKS     1,000 - 30,000     5 WEEKS     3,500 - 115,000   6-8 WEEKS     12,000 - 270,000    12 WEEKS     15,000 - 220,000        FEMALE AND NON-PREGNANT FEMALE:     LESS THAN 5 mIU/mL Performed at [facility], [address], [place], [place] [address]   RPR  Status: None   Collection Time: 03/20/24  3:11 AM  Result Value Ref Range   RPR Ser Ql NON REACTIVE NON REACTIVE    Comment: Performed at [facility], [address], [place], [place] [address]  HIV Antibody (routine testing w rflx)     Status: None   Collection Time: 03/20/24  3:11 AM  Result Value Ref Range   HIV Screen 4th Generation wRfx Non Reactive Non Reactive    Comment: Performed at [facility], [address], [place], [place] [address]  Wet prep, genital     Status: None   Collection Time: 03/20/24  3:15 AM   Specimen: PATH Cytology Cervicovaginal Ancillary Only  Result Value Ref Range   Yeast Wet Prep HPF POC NONE SEEN NONE SEEN   Trich, Wet Prep NONE SEEN NONE SEEN   Clue Cells Wet Prep HPF POC NONE SEEN NONE SEEN   WBC, Wet Prep HPF POC <10 <10   Sperm NONE SEEN     Comment: Performed at [facility], [address] [place], [place] [address]  hCG, quantitative, pregnancy     Status: Abnormal   Collection Time: 03/22/24  1:55 PM  Result Value Ref Range   hCG, Beta Chain, Quant, S 187 (H) <5 mIU/mL    Comment:          GEST. AGE      CONC.  (mIU/mL)   <=1 WEEK        5 - 50     2 WEEKS       50 - 500     3 WEEKS       100 - 10,000     4 WEEKS     1,000 - 30,000     5 WEEKS     3,500 - 115,000   6-8 WEEKS     12,000 - 270,000    12 WEEKS     15,000 - 220,000        FEMALE AND NON-PREGNANT FEMALE:     LESS THAN 5  mIU/mL Performed at [facility], [address], [place], [place] [address]     EXAM: OBSTETRIC <14 WK US  AND TRANSVAGINAL OB US    TECHNIQUE: Both transabdominal and transvaginal ultrasound examinations were performed for complete evaluation of the gestation as well as the maternal uterus, adnexal regions, and pelvic cul-de-sac. Transvaginal technique was performed to assess early pregnancy.   COMPARISON:  Pelvis ultrasound 11/06/2022.   FINDINGS: Intrauterine gestational sac: None. Maternal uterus/adnexae: Uterine fundal fibroid measuring about 2.5 cm redemonstrated on series 1, image 7. Endometrium appears bland throughout, thickened up to 13 mm. Left ovary appears normal measuring 2.2 x 1.2 x 1.2 cm (2 mL). Right ovary remarkable for a small 8-9 mm cystic appearing structure (series 1, image 41) with no surrounding hypervascularity. The right ovary is 3.8 x 1.8 x 1.5 cm (5 mL). No pelvis free fluid is identified. IMPRESSION: No IUP, suspicious adnexal mass, or pelvic free fluid identified. Differential diagnosis includes early IUP, failed IUP, and occult ectopic pregnancy. Recommend serial quantitative beta HCG and repeat ultrasound as necessary.  Electronically Signed   By: VEAR Hurst M.D.   On: 03/20/2024 05:29   Health Maintenance Due  Topic Date Due   Hepatitis C Screening  Never done   Hepatitis B Vaccines (1 of 3 - 19+ 3-dose series) Never done   HPV VACCINES (1 - 3-dose SCDM series) Never done  Cervical Cancer Screening (HPV/Pap Cotest)  Never done   COVID-19 Vaccine (1 - 2024-25 season) Never done    Labs, imaging and previous visits in Epic and Care Everywhere reviewed  Assessment & Plan:  1. SAB (spontaneous abortion) (Primary) - Support provided - Patient does not desire further follow-up  at this time. Precautions reviewed - Does not desire pregnancy at this time  2. General counseling and advice for contraceptive management - Desires Depo.  Reviewed risks and benefits. Due to out-of-pocket expense, patient elected to receive at health department. Barrier methods provided today  3. Language barrier affecting health care - in-person interpreter present at the bedside   Approximately 10 minutes of total time was spent with this patient on counseling and coordination of care  Return if symptoms worsen or fail to improve.  Hoyle Garre, NP 04/06/2024 5:24 PM

## 2024-06-20 ENCOUNTER — Other Ambulatory Visit: Payer: Self-pay | Admitting: *Deleted

## 2024-06-20 VITALS — BP 113/78 | Wt 146.0 lb

## 2024-06-20 DIAGNOSIS — Z124 Encounter for screening for malignant neoplasm of cervix: Secondary | ICD-10-CM

## 2024-06-20 DIAGNOSIS — N644 Mastodynia: Secondary | ICD-10-CM

## 2024-06-20 NOTE — Patient Instructions (Signed)
 Explained breast self awareness with Gabriela Morgan. Pap smear completed today. Let her know BCCCP will cover Pap smears and HPV typing every 5 years unless has a history of abnormal Pap smears. Referred patient to the [facility] for a diagnostic mammogram. Appointment scheduled Tuesday, June 27, 2024 at 1310. Patient aware of appointment and will be there. Let patient know will follow up with her within the next couple weeks with results of her Pap smear by letter or phone. Gabriela Morgan verbalized understanding.  Audia Amick, Wanda Ship, RN 10:44 AM

## 2024-06-20 NOTE — Progress Notes (Signed)
 [name] is a 43 y.o. [phone] female who presents to [facility] clinic today with complaint of left outer breast pain that comes and goes x 10 years. Patient rates the pain at a 8 out of 10. Patient stated she has never had a mammogram.   Pap Smear: Pap smear completed today. Last Pap smear was 3-4 years ago at a clinic in [facility] and was normal per patient. Per patient has no history of an abnormal Pap smear. Last Pap smear result is not available in Epic.   Physical exam: Breasts Breasts symmetrical. No skin abnormalities bilateral breasts. No nipple retraction bilateral breasts. No nipple discharge bilateral breasts. No lymphadenopathy. No lumps palpated bilateral breasts. Complaints of bilateral outer breast pain on exam greater within the left breast.       Pelvic/Bimanual Ext Genitalia No lesions, no swelling and no discharge observed on external genitalia.        Vagina Vagina pink and normal texture. No lesions or discharge observed in vagina.        Cervix Cervix is present. Cervix pink and of normal texture. Cervical polyp observed at cervical os. No discharge observed.    Uterus Uterus is present and palpable. Uterus in normal position and normal size.        Adnexae Bilateral ovaries present and palpable. No tenderness on palpation.         Rectovaginal No rectal exam completed today since patient had no rectal complaints. No skin abnormalities observed on exam.     Smoking History: Patient has never smoked.   Patient Navigation: Patient education provided. Access to services provided for patient through [facility] program. Spanish interpreter Gabriela Morgan from [facility] provided.    Breast and Cervical Cancer Risk Assessment: Patient does not have family history of breast cancer, known genetic mutations, or radiation treatment to the chest before age 31. Patient does not have history of cervical dysplasia, immunocompromised, or DES exposure  in-utero.  Risk Assessment   No risk assessment data     A: BCCCP exam with pap smear Complaint of left outer breast pain.  P: Referred patient to the [facility] for a diagnostic mammogram. Appointment scheduled Tuesday, June 27, 2024 at 1310.  Driscilla Wanda SQUIBB, RN 06/20/2024 10:44 AM

## 2024-06-21 LAB — CYTOLOGY - PAP
Comment: NEGATIVE
Diagnosis: NEGATIVE
High risk HPV: NEGATIVE

## 2024-06-22 ENCOUNTER — Ambulatory Visit: Payer: Self-pay

## 2024-06-27 ENCOUNTER — Other Ambulatory Visit: Payer: Self-pay

## 2024-06-27 ENCOUNTER — Inpatient Hospital Stay: Admission: RE | Admit: 2024-06-27 | Payer: Self-pay | Source: Ambulatory Visit

## 2024-08-09 ENCOUNTER — Other Ambulatory Visit: Payer: Self-pay
